# Patient Record
Sex: Male | Born: 1990 | Race: White | Hispanic: No | Marital: Single | Smoking: Current every day smoker
Health system: Southern US, Community
[De-identification: ages and names within clinical notes are randomized; demographics above are authoritative.]

---
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
## 2010-05-28 ENCOUNTER — Encounter
Admission: RE | Admit: 2010-05-28 | Discharge: 2010-05-28 | Payer: Self-pay | Source: Home / Self Care | Attending: Pediatrics | Admitting: Pediatrics

## 2011-02-09 ENCOUNTER — Inpatient Hospital Stay (INDEPENDENT_AMBULATORY_CARE_PROVIDER_SITE_OTHER)
Admission: RE | Admit: 2011-02-09 | Discharge: 2011-02-09 | Disposition: A | Discharge status: Home / Self Care | Payer: Self-pay | Source: Ambulatory Visit | Attending: Family Medicine | Admitting: Family Medicine

## 2011-02-09 DIAGNOSIS — M545 Low back pain, unspecified: Secondary | ICD-10-CM

## 2013-11-15 ENCOUNTER — Encounter (HOSPITAL_BASED_OUTPATIENT_CLINIC_OR_DEPARTMENT_OTHER): Payer: Self-pay | Admitting: Emergency Medicine

## 2013-11-15 ENCOUNTER — Emergency Department (HOSPITAL_BASED_OUTPATIENT_CLINIC_OR_DEPARTMENT_OTHER): Payer: Self-pay

## 2013-11-15 ENCOUNTER — Emergency Department (HOSPITAL_BASED_OUTPATIENT_CLINIC_OR_DEPARTMENT_OTHER)
Admission: EM | Admit: 2013-11-15 | Discharge: 2013-11-15 | Disposition: A | Discharge status: Home / Self Care | Payer: Self-pay | Attending: Emergency Medicine | Admitting: Emergency Medicine

## 2013-11-15 DIAGNOSIS — J680 Bronchitis and pneumonitis due to chemicals, gases, fumes and vapors: Secondary | ICD-10-CM | POA: Insufficient documentation

## 2013-11-15 DIAGNOSIS — F17200 Nicotine dependence, unspecified, uncomplicated: Secondary | ICD-10-CM | POA: Insufficient documentation

## 2013-11-15 DIAGNOSIS — R630 Anorexia: Secondary | ICD-10-CM | POA: Insufficient documentation

## 2013-11-15 MED ORDER — ALBUTEROL SULFATE (2.5 MG/3ML) 0.083% IN NEBU
5.0000 mg | INHALATION_SOLUTION | Freq: Once | RESPIRATORY_TRACT | Status: AC
  Administered 2013-11-15: 5 mg via RESPIRATORY_TRACT
  Filled 2013-11-15: qty 6

## 2013-11-15 MED ORDER — ACETAMINOPHEN 325 MG PO TABS
650.0000 mg | ORAL_TABLET | Freq: Once | ORAL | Status: AC
  Administered 2013-11-15: 650 mg via ORAL
  Filled 2013-11-15: qty 2

## 2013-11-15 MED ORDER — ALBUTEROL SULFATE HFA 108 (90 BASE) MCG/ACT IN AERS: 1.0000 | INHALATION_SPRAY | Freq: Four times a day (QID) | RESPIRATORY_TRACT | Status: DC | PRN

## 2013-11-15 NOTE — ED Provider Notes (Signed)
CSN: [id number]     Arrival date & time 11/15/13  1034 History   First MD Initiated Contact with Patient 11/15/13 1059     Chief Complaint  Patient presents with  . 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 Patient normally wears a mask however was doing a brief cut and decided not to put on. This has never happened before. Patient denies lung disease history or heart problems. Symptoms are mild. Headache and mild nausea afterwards gradual onset generalized.  Patient is a 23 y.o. male presenting with headaches. The history is provided by the patient.  Headache Associated symptoms: cough   Associated symptoms: no abdominal pain, no back pain, no congestion, no fever, no neck pain, no neck stiffness and no vomiting     History reviewed. No pertinent past medical history. History reviewed. No pertinent past surgical history. History reviewed. No pertinent family history. History  Substance Use Topics  . Smoking status: Current Every Day Smoker  . Smokeless tobacco: Not on file  . Alcohol Use: Not on file    Review of Systems  Constitutional: Positive for appetite change. Negative for fever and chills.  HENT: Negative for congestion.   Eyes: Negative for visual disturbance.  Respiratory: Positive for cough and shortness of breath.   Cardiovascular: Negative for chest pain.  Gastrointestinal: Negative for vomiting and abdominal pain.  Genitourinary: Negative for dysuria and flank pain.  Musculoskeletal: Negative for back pain, neck pain and neck stiffness.  Skin: Negative for rash.  Neurological: Positive for light-headedness and headaches.      Allergies  Review of patient's allergies indicates no known  allergies.  Home Medications   Prior to Admission medications   Not on File   BP 130/78  Pulse 63  Temp(Src) 98 F (36.7 C) (Oral)  Resp 18  SpO2 100% Physical Exam  Nursing note and vitals reviewed. Constitutional: He is oriented to person, place, and time. He appears well-developed and well-nourished.  HENT:  Head: Normocephalic and atraumatic.  Eyes: Conjunctivae are normal. Right eye exhibits no discharge. Left eye exhibits no discharge.  Neck: Normal range of motion. Neck supple. No tracheal deviation present.  Cardiovascular: Normal rate and regular rhythm.   Pulmonary/Chest: Effort normal and breath sounds normal.  Neurological: He is alert and oriented to person, place, and time. No cranial nerve deficit.  Skin: Skin is warm. No rash noted.  Psychiatric: He has a normal mood and affect.    ED Course  Procedures (including critical care time) Labs Review Labs Reviewed - No data to display  Imaging Review No results found.   EKG Interpretation None      MDM   Final diagnoses:  Chemical pneumonitis   Well-appearing male with clinically chemical pneumonitis from breathing in dust and chemicals at work. Stressed the importance of quitting smoking and wearing protective equipment. Nebulizer in the ED and chest x-ray. Followup discussed.  Pt refused xray due to cost, pt did well in ED.   Smoking cessation and outpatient follow up was discussed with the patient for 3 to 5  Minutes.   Results and differential diagnosis were discussed with the patient/parent/guardian. Close follow up outpatient was discussed, comfortable with the plan.   Medications  albuterol (PROVENTIL) (2.5 MG/3ML) 0.083% nebulizer solution 5 mg (5 mg Nebulization Given 11/15/13 1108)  acetaminophen (  TYLENOL) tablet 650 mg (650 mg Oral Given 11/15/13 1107)    Filed Vitals:   11/15/13 1043 11/15/13 1108  BP: 130/78   Pulse: 63   Temp: 98 F (36.7 C)   TempSrc: Oral   Resp: 18   SpO2:  100% 100%         Enid Skeens, MD 11/16/13 0710

## 2013-11-15 NOTE — Discharge Instructions (Signed)
If you were given medicines take as directed.  If you are on coumadin or contraceptives realize their levels and effectiveness is altered by many different medicines.  If you have any reaction (rash, tongues swelling, other) to the medicines stop taking and see a physician.   Please follow up as directed and return to the ER or see a physician for new or worsening symptoms.  Thank you. Filed Vitals:   11/15/13 1043 11/15/13 1108  BP: 130/78   Pulse: 63   Temp: 98 F (36.7 C)   TempSrc: Oral   Resp: 18   SpO2: 100% 100%

## 2013-11-15 NOTE — ED Notes (Signed)
Pt refused x-ray d/t no insurance. MD aware.
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
## 2017-02-01 ENCOUNTER — Emergency Department (HOSPITAL_COMMUNITY): Payer: BLUE CROSS/BLUE SHIELD

## 2017-02-01 ENCOUNTER — Emergency Department (HOSPITAL_COMMUNITY)
Admission: EM | Admit: 2017-02-01 | Discharge: 2017-02-01 | Disposition: A | Discharge status: Home / Self Care | Payer: BLUE CROSS/BLUE SHIELD | Attending: Emergency Medicine | Admitting: Emergency Medicine

## 2017-02-01 ENCOUNTER — Encounter (HOSPITAL_COMMUNITY): Payer: Self-pay | Admitting: Emergency Medicine

## 2017-02-01 DIAGNOSIS — R200 Anesthesia of skin: Secondary | ICD-10-CM | POA: Diagnosis not present

## 2017-02-01 DIAGNOSIS — F17210 Nicotine dependence, cigarettes, uncomplicated: Secondary | ICD-10-CM | POA: Diagnosis not present

## 2017-02-01 DIAGNOSIS — Y939 Activity, unspecified: Secondary | ICD-10-CM | POA: Insufficient documentation

## 2017-02-01 DIAGNOSIS — R51 Headache: Secondary | ICD-10-CM | POA: Diagnosis not present

## 2017-02-01 DIAGNOSIS — R109 Unspecified abdominal pain: Secondary | ICD-10-CM | POA: Insufficient documentation

## 2017-02-01 DIAGNOSIS — S3991XA Unspecified injury of abdomen, initial encounter: Secondary | ICD-10-CM | POA: Diagnosis present

## 2017-02-01 DIAGNOSIS — S301XXA Contusion of abdominal wall, initial encounter: Secondary | ICD-10-CM | POA: Diagnosis not present

## 2017-02-01 DIAGNOSIS — S3981XA Other specified injuries of abdomen, initial encounter: Secondary | ICD-10-CM

## 2017-02-01 DIAGNOSIS — T07XXXA Unspecified multiple injuries, initial encounter: Secondary | ICD-10-CM

## 2017-02-01 DIAGNOSIS — Y92410 Unspecified street and highway as the place of occurrence of the external cause: Secondary | ICD-10-CM | POA: Diagnosis not present

## 2017-02-01 DIAGNOSIS — Y999 Unspecified external cause status: Secondary | ICD-10-CM | POA: Diagnosis not present

## 2017-02-01 LAB — COMPREHENSIVE METABOLIC PANEL
ALK PHOS: 56 U/L (ref 38–126)
ALT: 18 U/L (ref 17–63)
AST: 21 U/L (ref 15–41)
Albumin: 4.2 g/dL (ref 3.5–5.0)
Anion gap: 7 (ref 5–15)
BUN: 9 mg/dL (ref 6–20)
CO2: 25 mmol/L (ref 22–32)
CREATININE: 0.91 mg/dL (ref 0.61–1.24)
Calcium: 9 mg/dL (ref 8.9–10.3)
Chloride: 104 mmol/L (ref 101–111)
GFR calc Af Amer: >60 mL/min (ref >60)
Glucose, Bld: 85 mg/dL (ref 65–99)
Potassium: 4.1 mmol/L (ref 3.5–5.1)
Sodium: 136 mmol/L (ref 135–145)
TOTAL PROTEIN: 6.7 g/dL (ref 6.5–8.1)
Total Bilirubin: 0.9 mg/dL (ref 0.3–1.2)

## 2017-02-01 LAB — CBC
HCT: 44.7 % (ref 39.0–52.0)
Hemoglobin: 15.4 g/dL (ref 13.0–17.0)
MCH: 31.6 pg (ref 26.0–34.0)
MCHC: 34.5 g/dL (ref 30.0–36.0)
MCV: 91.8 fL (ref 78.0–100.0)
PLATELETS: 164 10*3/uL (ref 150–400)
RBC: 4.87 MIL/uL (ref 4.22–5.81)
RDW: 12.6 % (ref 11.5–15.5)
WBC: 16.1 10*3/uL — AB (ref 4.0–10.5)

## 2017-02-01 LAB — I-STAT CHEM 8, ED
BUN: 10 mg/dL (ref 6–20)
CREATININE: 0.8 mg/dL (ref 0.61–1.24)
Calcium, Ion: 1.13 mmol/L — ABNORMAL LOW (ref 1.15–1.40)
Chloride: 103 mmol/L (ref 101–111)
Glucose, Bld: 82 mg/dL (ref 65–99)
HEMATOCRIT: 46 % (ref 39.0–52.0)
Hemoglobin: 15.6 g/dL (ref 13.0–17.0)
POTASSIUM: 4.1 mmol/L (ref 3.5–5.1)
Sodium: 139 mmol/L (ref 135–145)
TCO2: 25 mmol/L (ref 22–32)

## 2017-02-01 LAB — URINALYSIS, ROUTINE W REFLEX MICROSCOPIC
BILIRUBIN URINE: NEGATIVE
Glucose, UA: NEGATIVE mg/dL
Hgb urine dipstick: NEGATIVE
KETONES UR: NEGATIVE mg/dL
Leukocytes, UA: NEGATIVE
NITRITE: NEGATIVE
PH: 6 (ref 5.0–8.0)
Protein, ur: NEGATIVE mg/dL
Specific Gravity, Urine: 1.034 — ABNORMAL HIGH (ref 1.005–1.030)

## 2017-02-01 LAB — PROTIME-INR
INR: 1.09
PROTHROMBIN TIME: 14 s (ref 11.4–15.2)

## 2017-02-01 LAB — SAMPLE TO BLOOD BANK

## 2017-02-01 LAB — I-STAT CG4 LACTIC ACID, ED: Lactic Acid, Venous: 0.99 mmol/L (ref 0.5–1.9)

## 2017-02-01 LAB — ETHANOL: Alcohol, Ethyl (B): <10 mg/dL (ref <10)

## 2017-02-01 MED ORDER — IOPAMIDOL (ISOVUE-300) INJECTION 61%
INTRAVENOUS | Status: AC
  Administered 2017-02-01: 100 mL
  Filled 2017-02-01: qty 100

## 2017-02-01 MED ORDER — NICOTINE 21 MG/24HR TD PT24
21.0000 mg | MEDICATED_PATCH | Freq: Every day | TRANSDERMAL | Status: DC
  Administered 2017-02-01: 21 mg via TRANSDERMAL
  Filled 2017-02-01: qty 1

## 2017-02-01 MED ORDER — SODIUM CHLORIDE 0.9 % IV BOLUS (SEPSIS)
500.0000 mL | Freq: Once | INTRAVENOUS | Status: AC
  Administered 2017-02-01: 500 mL via INTRAVENOUS

## 2017-02-01 MED ORDER — ONDANSETRON HCL 4 MG/2ML IJ SOLN
4.0000 mg | Freq: Once | INTRAMUSCULAR | Status: AC
  Administered 2017-02-01: 4 mg via INTRAVENOUS
  Filled 2017-02-01: qty 2

## 2017-02-01 MED ORDER — FENTANYL CITRATE (PF) 100 MCG/2ML IJ SOLN
50.0000 ug | Freq: Once | INTRAMUSCULAR | Status: AC
  Administered 2017-02-01: 50 ug via INTRAVENOUS
  Filled 2017-02-01: qty 2

## 2017-02-01 NOTE — ED Notes (Signed)
Patient transported to X-ray 
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
## 2017-02-01 NOTE — Discharge Instructions (Signed)

## 2017-02-01 NOTE — ED Notes (Signed)
PT states understanding of care given, follow up care, and medication prescribed. PT ambulated from ED to car with a steady gait. 
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
## 2021-07-13 ENCOUNTER — Other Ambulatory Visit: Payer: Self-pay

## 2021-07-13 ENCOUNTER — Ambulatory Visit (INDEPENDENT_AMBULATORY_CARE_PROVIDER_SITE_OTHER): Payer: Self-pay | Admitting: Orthopaedic Surgery

## 2021-07-13 DIAGNOSIS — S83512A Sprain of anterior cruciate ligament of left knee, initial encounter: Secondary | ICD-10-CM | POA: Insufficient documentation

## 2021-07-13 NOTE — Progress Notes (Signed)
? ?Office Visit Note ?  ?Patient: Keith Frank           ?Date of Birth: April 14, 1991           ?MRN: [id number] ?Visit Date: 07/13/2021 ?             ?Requested by: No referring provider defined for this encounter. ?PCP: Patient, No Pcp Per (Inactive) ? ? ?Assessment & Plan: ?Visit Diagnoses:  ?1. New ACL tear, left, initial encounter   ? ? ?Plan: Impression is ACL tear and MCL tear.  Has been entire year since the injury so we will need an updated MRI mainly to look at the condition of the MCL to see if this has healed as well as to evaluate the medial meniscus which was torn on the initial MRI.  This will need to be done prior to surgery for surgical planning since the patient is interested in pursuing surgery at this time. ? ?Follow-Up Instructions: No follow-ups on file.  ? ?Orders:  ?No orders of the defined types were placed in this encounter. ? ?No orders of the defined types were placed in this encounter. ? ? ? ? Procedures: ?No procedures performed ? ? ?Clinical Data: ?No additional findings. ? ? ?Subjective: ?Chief Complaint  ?Patient presents with  ? Left Knee - Pain  ? ? ?HPI ? 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  He had an MRI done at [facility] tried imaging which confirmed this.  Unfortunately due to financial constraints he did not pursue surgery.  However in the last year he has had ongoing symptoms to his left knee with pain and locking popping stiffness and giving way.  Currently unemployed.  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 ? ?Review of Systems  ?Constitutional: Negative.   ?All other systems reviewed and are negative. ? ? ?Objective: ?Vital Signs: There were no vitals taken for this visit. ? ?Physical Exam ?Vitals and nursing note reviewed.  ?Constitutional:   ?   Appearance: He is well-developed.  ?HENT:  ?   Head: Normocephalic and atraumatic.  ?Eyes:  ?   Pupils: Pupils  are equal, round, and reactive to light.  ?Pulmonary:  ?   Effort: Pulmonary effort is normal.  ?Abdominal:  ?   Palpations: Abdomen is soft.  ?Musculoskeletal:     ?   General: Normal range of motion.  ?   Cervical back: Neck supple.  ?Skin: ?   General: Skin is warm.  ?Neurological:  ?   Mental Status: He is alert and oriented to person, place, and time.  ?Psychiatric:     ?   Behavior: Behavior normal.     ?   Thought Content: Thought content normal.     ?   Judgment: Judgment normal.  ? ? ?Ortho Exam ? ?Examination of the left knee shows unstable Lachman and anterior drawer.  Pivot shift.  MCL testing shows 2+ laxity with solid endpoint.  PCL feels stable. ? ?Specialty Comments:  ?No specialty comments available. ? ?Imaging: ?No results found. ? ? ?PMFS History: ?Patient Active Problem List  ? Diagnosis Date Noted  ? New ACL tear, left, initial encounter 07/13/2021  ? ?No past medical history on file.  ?No family history on file.  ?No past surgical history on file. ?Social History  ? ?Occupational History  ? Not on file  ?Tobacco Use  ?  Smoking status: Every Day  ? Smokeless tobacco: Never  ?Substance and Sexual Activity  ? Alcohol use: Yes  ? Drug use: No  ? Sexual activity: Not on file  ? ? ? ? ? ? ?

## 2021-08-09 ENCOUNTER — Telehealth: Payer: Self-pay | Admitting: *Deleted

## 2021-08-09 DIAGNOSIS — S83512A Sprain of anterior cruciate ligament of left knee, initial encounter: Secondary | ICD-10-CM

## 2021-08-09 NOTE — Telephone Encounter (Signed)
Pt called stating he was in on March 15 saw Dr. Erlinda Hong and Erlinda Hong had mentioned getting an MRI done of knee. Pt wants to go ahead and get that done now that he has insurance.  ?

## 2021-08-09 NOTE — Telephone Encounter (Signed)
Yes, thank you.

## 2021-08-09 NOTE — Telephone Encounter (Signed)
Order entered. Thanks.

## 2021-08-09 NOTE — Telephone Encounter (Signed)
Ok to order 

## 2021-08-14 ENCOUNTER — Ambulatory Visit
Admission: RE | Admit: 2021-08-14 | Discharge: 2021-08-14 | Disposition: A | Discharge status: Home / Self Care | Payer: Medicaid Other | Source: Ambulatory Visit | Attending: Physician Assistant | Admitting: Physician Assistant

## 2021-08-14 DIAGNOSIS — S83512A Sprain of anterior cruciate ligament of left knee, initial encounter: Secondary | ICD-10-CM

## 2021-08-16 NOTE — Progress Notes (Signed)
Fu to discuss mri

## 2021-08-17 ENCOUNTER — Telehealth: Payer: Self-pay | Admitting: Orthopaedic Surgery

## 2021-08-17 NOTE — Telephone Encounter (Signed)
Called pt but unable to leave vm, pt needs to sch f/u to discuss mri with Roda Shutters ?

## 2021-08-26 ENCOUNTER — Ambulatory Visit (INDEPENDENT_AMBULATORY_CARE_PROVIDER_SITE_OTHER): Payer: Self-pay | Admitting: Orthopaedic Surgery

## 2021-08-26 ENCOUNTER — Encounter: Payer: Self-pay | Admitting: Orthopaedic Surgery

## 2021-08-26 DIAGNOSIS — S83242A Other tear of medial meniscus, current injury, left knee, initial encounter: Secondary | ICD-10-CM

## 2021-08-26 DIAGNOSIS — S83512A Sprain of anterior cruciate ligament of left knee, initial encounter: Secondary | ICD-10-CM

## 2021-08-26 NOTE — Progress Notes (Signed)
? ?Office Visit Note ?  ?Patient: Keith Frank           ?Date of Birth: 11/29/1990           ?MRN: [id number] ?Visit Date: 08/26/2021 ?             ?Requested by: No referring provider defined for this encounter. ?PCP: Patient, No Pcp Per (Inactive) ? ? ?Assessment & Plan: ?Visit Diagnoses:  ?1. New ACL tear, left, initial encounter   ?2. Acute medial meniscus tear of left knee, initial encounter   ? ? ?Plan: MRI of the left knee shows undersurface tear of the medial meniscus, chronic partial tear of the ACL and thickening of the MCL.  Functionally his ACL is ruptured.  The ACL appears amorphus on MRI and I do not feel that the remaining fibers are functional.  Certainly he is reporting instability and giving way and physical exam is consistent with ACL rupture.  I have recommended ACL reconstruction with quadriceps autograft.  We will evaluate the medial meniscus as well.  MCL does not need any surgical intervention.  Risk benefits rehab recovery prognosis of the surgery reviewed with [name] and his mother in detail.  All questions answered to their satisfaction.  Eunice Blase will call the patient to schedule surgery. ? ?Total face to face encounter time was greater than 25 minutes and over half of this time was spent in counseling and/or coordination of care. ? ?Follow-Up Instructions: No follow-ups on file.  ? ?Orders:  ?No orders of the defined types were placed in this encounter. ? ?No orders of the defined types were placed in this encounter. ? ? ? ? Procedures: ?No procedures performed ? ? ?Clinical Data: ?No additional findings. ? ? ?Subjective: ?Chief Complaint  ?Patient presents with  ? Left Knee - Pain  ? ? ?HPI ? ?[name] returns with his mother today to review recent MRI of the left knee. ? ?Review of Systems  ?Constitutional: Negative.   ?All other systems reviewed and are negative. ? ? ?Objective: ?Vital Signs: There were no vitals taken for this visit. ? ?Physical Exam ?Vitals and nursing note reviewed.   ?Constitutional:   ?   Appearance: He is well-developed.  ?Pulmonary:  ?   Effort: Pulmonary effort is normal.  ?Abdominal:  ?   Palpations: Abdomen is soft.  ?Skin: ?   General: Skin is warm.  ?Neurological:  ?   Mental Status: He is alert and oriented to person, place, and time.  ?Psychiatric:     ?   Behavior: Behavior normal.     ?   Thought Content: Thought content normal.     ?   Judgment: Judgment normal.  ? ? ?Ortho Exam ? ?Examination of the left knee shows normal range of motion.  MCL shows 1+ with solid endpoint.  Abnormal anterior drawer and Lachman test.  Negative pivot shift.  No medial joint line tenderness. ? ?Specialty Comments:  ?No specialty comments available. ? ?Imaging: ?No results found. ? ? ?PMFS History: ?Patient Active Problem List  ? Diagnosis Date Noted  ? Acute medial meniscus tear of left knee 08/26/2021  ? New ACL tear, left, initial encounter 07/13/2021  ? ?History reviewed. No pertinent past medical history.  ?History reviewed. No pertinent family history.  ?History reviewed. No pertinent surgical history. ?Social History  ? ?Occupational History  ? Not on file  ?Tobacco Use  ? Smoking status: Every Day  ? Smokeless tobacco: Never  ?Substance and Sexual Activity  ?  Alcohol use: Yes  ? Drug use: No  ? Sexual activity: Not on file  ? ? ? ? ? ? ?

## 2021-09-15 ENCOUNTER — Telehealth: Payer: Self-pay | Admitting: Orthopaedic Surgery

## 2021-09-15 NOTE — Telephone Encounter (Signed)
Pt called stating his cell phone has been off for awhile and been using a wifi number. If we need to contact pt because he has upcoming surgery in June. Pt phone number is [phone]. ?

## 2021-09-17 ENCOUNTER — Encounter (HOSPITAL_BASED_OUTPATIENT_CLINIC_OR_DEPARTMENT_OTHER): Payer: Self-pay | Admitting: Orthopaedic Surgery

## 2021-09-17 ENCOUNTER — Other Ambulatory Visit: Payer: Self-pay

## 2021-09-28 NOTE — Progress Notes (Signed)

## 2021-09-29 ENCOUNTER — Encounter: Payer: Self-pay | Admitting: Orthopaedic Surgery

## 2021-09-29 ENCOUNTER — Ambulatory Visit (HOSPITAL_BASED_OUTPATIENT_CLINIC_OR_DEPARTMENT_OTHER): Payer: Self-pay

## 2021-09-29 ENCOUNTER — Other Ambulatory Visit: Payer: Self-pay

## 2021-09-29 ENCOUNTER — Ambulatory Visit (HOSPITAL_BASED_OUTPATIENT_CLINIC_OR_DEPARTMENT_OTHER)
Admission: RE | Admit: 2021-09-29 | Discharge: 2021-09-29 | Disposition: A | Discharge status: Home / Self Care | Payer: Self-pay | Attending: Orthopaedic Surgery | Admitting: Orthopaedic Surgery

## 2021-09-29 ENCOUNTER — Encounter (HOSPITAL_BASED_OUTPATIENT_CLINIC_OR_DEPARTMENT_OTHER): Payer: Self-pay | Admitting: Orthopaedic Surgery

## 2021-09-29 ENCOUNTER — Ambulatory Visit (HOSPITAL_BASED_OUTPATIENT_CLINIC_OR_DEPARTMENT_OTHER): Payer: Self-pay | Admitting: Anesthesiology

## 2021-09-29 ENCOUNTER — Encounter (HOSPITAL_BASED_OUTPATIENT_CLINIC_OR_DEPARTMENT_OTHER)
Admission: RE | Disposition: A | Discharge status: Home / Self Care | Payer: Self-pay | Source: Home / Self Care | Attending: Orthopaedic Surgery

## 2021-09-29 DIAGNOSIS — S83242A Other tear of medial meniscus, current injury, left knee, initial encounter: Secondary | ICD-10-CM

## 2021-09-29 DIAGNOSIS — F17200 Nicotine dependence, unspecified, uncomplicated: Secondary | ICD-10-CM | POA: Insufficient documentation

## 2021-09-29 DIAGNOSIS — S83512A Sprain of anterior cruciate ligament of left knee, initial encounter: Secondary | ICD-10-CM

## 2021-09-29 DIAGNOSIS — Z01818 Encounter for other preprocedural examination: Secondary | ICD-10-CM

## 2021-09-29 DIAGNOSIS — X58XXXA Exposure to other specified factors, initial encounter: Secondary | ICD-10-CM | POA: Insufficient documentation

## 2021-09-29 HISTORY — PX: KNEE ARTHROSCOPY WITH ANTERIOR CRUCIATE LIGAMENT (ACL) REPAIR: SHX5644

## 2021-09-29 SURGERY — KNEE ARTHROSCOPY WITH ANTERIOR CRUCIATE LIGAMENT (ACL) REPAIR
Anesthesia: Regional | Site: Knee | Laterality: Left

## 2021-09-29 MED ORDER — PROPOFOL 10 MG/ML IV BOLUS
INTRAVENOUS | Status: DC | PRN
  Administered 2021-09-29: 200 mg via INTRAVENOUS

## 2021-09-29 MED ORDER — LACTATED RINGERS IV SOLN: INTRAVENOUS | Status: DC

## 2021-09-29 MED ORDER — ONDANSETRON HCL 4 MG PO TABS: 4.0000 mg | ORAL_TABLET | Freq: Three times a day (TID) | ORAL | 0 refills | Status: AC | PRN

## 2021-09-29 MED ORDER — FENTANYL CITRATE (PF) 100 MCG/2ML IJ SOLN
INTRAMUSCULAR | Status: AC
  Filled 2021-09-29: qty 2

## 2021-09-29 MED ORDER — OXYCODONE HCL 5 MG PO TABS
ORAL_TABLET | ORAL | Status: AC
  Filled 2021-09-29: qty 1

## 2021-09-29 MED ORDER — ACETAMINOPHEN 500 MG PO TABS
1000.0000 mg | ORAL_TABLET | Freq: Once | ORAL | Status: AC
  Administered 2021-09-29: 1000 mg via ORAL

## 2021-09-29 MED ORDER — AMISULPRIDE (ANTIEMETIC) 5 MG/2ML IV SOLN: 10.0000 mg | Freq: Once | INTRAVENOUS | Status: DC | PRN

## 2021-09-29 MED ORDER — ACETAMINOPHEN 500 MG PO TABS
ORAL_TABLET | ORAL | Status: AC
  Filled 2021-09-29: qty 2

## 2021-09-29 MED ORDER — MIDAZOLAM HCL 2 MG/2ML IJ SOLN
2.0000 mg | Freq: Once | INTRAMUSCULAR | Status: AC
  Administered 2021-09-29: 2 mg via INTRAVENOUS

## 2021-09-29 MED ORDER — OXYCODONE-ACETAMINOPHEN 5-325 MG PO TABS: 1.0000 | ORAL_TABLET | Freq: Two times a day (BID) | ORAL | 0 refills | Status: DC | PRN

## 2021-09-29 MED ORDER — OXYCODONE HCL 5 MG PO TABS
5.0000 mg | ORAL_TABLET | Freq: Once | ORAL | Status: AC | PRN
  Administered 2021-09-29: 5 mg via ORAL

## 2021-09-29 MED ORDER — LIDOCAINE 2% (20 MG/ML) 5 ML SYRINGE
INTRAMUSCULAR | Status: AC
Start: 2021-09-29 — End: ?
  Filled 2021-09-29: qty 5

## 2021-09-29 MED ORDER — FENTANYL CITRATE (PF) 100 MCG/2ML IJ SOLN
25.0000 ug | INTRAMUSCULAR | Status: DC | PRN
  Administered 2021-09-29 (×2): 50 ug via INTRAVENOUS

## 2021-09-29 MED ORDER — ONDANSETRON HCL 4 MG/2ML IJ SOLN
INTRAMUSCULAR | Status: AC
  Filled 2021-09-29: qty 2

## 2021-09-29 MED ORDER — FENTANYL CITRATE (PF) 100 MCG/2ML IJ SOLN
100.0000 ug | Freq: Once | INTRAMUSCULAR | Status: AC
  Administered 2021-09-29: 100 ug via INTRAVENOUS

## 2021-09-29 MED ORDER — DEXAMETHASONE SODIUM PHOSPHATE 10 MG/ML IJ SOLN
INTRAMUSCULAR | Status: AC
Start: 2021-09-29 — End: ?
  Filled 2021-09-29: qty 1

## 2021-09-29 MED ORDER — MIDAZOLAM HCL 2 MG/2ML IJ SOLN
INTRAMUSCULAR | Status: AC
  Filled 2021-09-29: qty 2

## 2021-09-29 MED ORDER — ONDANSETRON HCL 4 MG/2ML IJ SOLN: 4.0000 mg | Freq: Once | INTRAMUSCULAR | Status: DC | PRN

## 2021-09-29 MED ORDER — LIDOCAINE HCL (CARDIAC) PF 100 MG/5ML IV SOSY
PREFILLED_SYRINGE | INTRAVENOUS | Status: DC | PRN
  Administered 2021-09-29: 80 mg via INTRAVENOUS

## 2021-09-29 MED ORDER — SENNOSIDES-DOCUSATE SODIUM 8.6-50 MG PO TABS: 1.0000 | ORAL_TABLET | Freq: Every evening | ORAL | 1 refills | Status: AC | PRN

## 2021-09-29 MED ORDER — OXYCODONE HCL 5 MG/5ML PO SOLN: 5.0000 mg | Freq: Once | ORAL | Status: AC | PRN

## 2021-09-29 MED ORDER — CEFAZOLIN SODIUM-DEXTROSE 2-4 GM/100ML-% IV SOLN
INTRAVENOUS | Status: AC
  Filled 2021-09-29: qty 100

## 2021-09-29 MED ORDER — METHOCARBAMOL 500 MG PO TABS: 500.0000 mg | ORAL_TABLET | Freq: Four times a day (QID) | ORAL | 2 refills | Status: AC | PRN

## 2021-09-29 MED ORDER — BUPIVACAINE HCL (PF) 0.5 % IJ SOLN
INTRAMUSCULAR | Status: DC | PRN
  Administered 2021-09-29: 30 mL

## 2021-09-29 MED ORDER — DEXAMETHASONE SODIUM PHOSPHATE 10 MG/ML IJ SOLN
INTRAMUSCULAR | Status: DC | PRN
  Administered 2021-09-29: 10 mg via INTRAVENOUS

## 2021-09-29 MED ORDER — KETOROLAC TROMETHAMINE 10 MG PO TABS: 10.0000 mg | ORAL_TABLET | Freq: Two times a day (BID) | ORAL | 0 refills | Status: AC | PRN

## 2021-09-29 MED ORDER — FENTANYL CITRATE (PF) 100 MCG/2ML IJ SOLN
INTRAMUSCULAR | Status: DC | PRN
Start: 2021-09-29 — End: 2021-09-29
  Administered 2021-09-29 (×4): 50 ug via INTRAVENOUS

## 2021-09-29 MED ORDER — CEFAZOLIN SODIUM-DEXTROSE 2-4 GM/100ML-% IV SOLN
2.0000 g | INTRAVENOUS | Status: AC
  Administered 2021-09-29: 2 g via INTRAVENOUS

## 2021-09-29 MED ORDER — ONDANSETRON HCL 4 MG/2ML IJ SOLN
INTRAMUSCULAR | Status: DC | PRN
  Administered 2021-09-29: 4 mg via INTRAVENOUS

## 2021-09-29 MED ORDER — SODIUM CHLORIDE 0.9 % IR SOLN
Status: DC | PRN
  Administered 2021-09-29: 12000 mL

## 2021-09-29 SURGICAL SUPPLY — 82 items
APL SKNCLS STERI-STRIP NONHPOA (GAUZE/BANDAGES/DRESSINGS) ×1
BANDAGE ESMARK 6X9 LF (GAUZE/BANDAGES/DRESSINGS) ×1 IMPLANT
BENZOIN TINCTURE PRP APPL 2/3 (GAUZE/BANDAGES/DRESSINGS) ×2 IMPLANT
BLADE AVERAGE 25X9 (BLADE) ×2 IMPLANT
BLADE SHAVER TORPEDO 4X13 (MISCELLANEOUS) ×1 IMPLANT
BLADE SURG 15 STRL LF DISP TIS (BLADE) ×1 IMPLANT
BLADE SURG 15 STRL SS (BLADE) ×2
BNDG CMPR 9X6 STRL LF SNTH (GAUZE/BANDAGES/DRESSINGS) ×1
BNDG ELASTIC 6X5.8 VLCR STR LF (GAUZE/BANDAGES/DRESSINGS) ×2 IMPLANT
BNDG ESMARK 6X9 LF (GAUZE/BANDAGES/DRESSINGS) ×2
COOLER ICEMAN CLASSIC (MISCELLANEOUS) ×2 IMPLANT
COVER BACK TABLE 60X90IN (DRAPES) ×1 IMPLANT
CUFF TOURN SGL QUICK 18X4 (TOURNIQUET CUFF) ×1 IMPLANT
CUFF TOURN SGL QUICK 34 (TOURNIQUET CUFF) ×2
CUFF TRNQT CYL 34X4.125X (TOURNIQUET CUFF) ×1 IMPLANT
DRAPE ARTHROSCOPY W/POUCH 90 (DRAPES) ×2 IMPLANT
DRAPE IMP U-DRAPE 54X76 (DRAPES) ×2 IMPLANT
DRAPE INCISE IOBAN 66X45 STRL (DRAPES) IMPLANT
DRAPE U-SHAPE 47X51 STRL (DRAPES) ×2 IMPLANT
DRAPE-T ARTHROSCOPY W/POUCH (DRAPES) ×1 IMPLANT
DRSG PAD ABDOMINAL 8X10 ST (GAUZE/BANDAGES/DRESSINGS) ×2 IMPLANT
DURAPREP 26ML APPLICATOR (WOUND CARE) ×3 IMPLANT
ELECT REM PT RETURN 9FT ADLT (ELECTROSURGICAL) ×2
ELECTRODE REM PT RTRN 9FT ADLT (ELECTROSURGICAL) ×1 IMPLANT
GAUZE SPONGE 4X4 12PLY STRL (GAUZE/BANDAGES/DRESSINGS) ×4 IMPLANT
GAUZE XEROFORM 1X8 LF (GAUZE/BANDAGES/DRESSINGS) ×2 IMPLANT
GLOVE ECLIPSE 7.0 STRL STRAW (GLOVE) ×4 IMPLANT
GLOVE INDICATOR 7.0 STRL GRN (GLOVE) ×3 IMPLANT
GLOVE INDICATOR 7.5 STRL GRN (GLOVE) ×3 IMPLANT
GLOVE SURG SYN 7.5  E (GLOVE) ×2
GLOVE SURG SYN 7.5 E (GLOVE) ×1 IMPLANT
GLOVE SURG SYN 7.5 PF PI (GLOVE) ×1 IMPLANT
GOWN STRL REIN XL XLG (GOWN DISPOSABLE) ×2 IMPLANT
GOWN STRL REUS W/ TWL LRG LVL3 (GOWN DISPOSABLE) ×1 IMPLANT
GOWN STRL REUS W/ TWL XL LVL3 (GOWN DISPOSABLE) ×1 IMPLANT
GOWN STRL REUS W/TWL LRG LVL3 (GOWN DISPOSABLE) ×2
GOWN STRL REUS W/TWL XL LVL3 (GOWN DISPOSABLE) ×2
IMMOBILIZER KNEE 22 UNIV (SOFTGOODS) ×1 IMPLANT
IMMOBILIZER KNEE 24 THIGH 36 (MISCELLANEOUS) IMPLANT
IMMOBILIZER KNEE 24 UNIV (MISCELLANEOUS)
IMP SYS 2ND FIX PEEK 4.75X19.1 (Miscellaneous) ×2 IMPLANT
IMPL QUADLINK SYSTEM 9 (Orthopedic Implant) IMPLANT
IMPL SYS 2ND FX PEEK 4.75X19.1 (Miscellaneous) IMPLANT
IMPLANT QUADLINK SYSTEM 9 (Orthopedic Implant) ×2 IMPLANT
KIT TRANSTIBIAL (DISPOSABLE) ×2 IMPLANT
KNIFE GRAFT ACL 10MM 5952 (MISCELLANEOUS) ×2 IMPLANT
KNIFE GRAFT ACL 9MM (MISCELLANEOUS) ×3 IMPLANT
MANIFOLD NEPTUNE II (INSTRUMENTS) ×2 IMPLANT
NDL SUT 6 .5 CRC .975X.05 MAYO (NEEDLE) IMPLANT
NEEDLE MAYO TAPER (NEEDLE)
PACK ARTHROSCOPY DSU (CUSTOM PROCEDURE TRAY) ×2 IMPLANT
PACK BASIN DAY SURGERY FS (CUSTOM PROCEDURE TRAY) ×2 IMPLANT
PAD COLD SHLDR WRAP-ON (PAD) ×2 IMPLANT
PADDING CAST COTTON 6X4 STRL (CAST SUPPLIES) ×2 IMPLANT
PENCIL SMOKE EVACUATOR (MISCELLANEOUS) ×2 IMPLANT
PORT APPOLLO RF 90DEGREE MULTI (SURGICAL WAND) ×1 IMPLANT
SHEET MEDIUM DRAPE 40X70 STRL (DRAPES) ×2 IMPLANT
SLEEVE SCD COMPRESS KNEE MED (STOCKING) ×2 IMPLANT
SPONGE T-LAP 18X18 ~~LOC~~+RFID (SPONGE) ×2 IMPLANT
SPONGE T-LAP 4X18 ~~LOC~~+RFID (SPONGE) ×2 IMPLANT
STRIP CLOSURE SKIN 1/2X4 (GAUZE/BANDAGES/DRESSINGS) ×4 IMPLANT
SUCTION FRAZIER HANDLE 10FR (MISCELLANEOUS) ×2
SUCTION TUBE FRAZIER 10FR DISP (MISCELLANEOUS) ×1 IMPLANT
SUT ETHILON 2 0 FS 18 (SUTURE) IMPLANT
SUT ETHILON 3 0 PS 1 (SUTURE) IMPLANT
SUT ETHILON 4 0 PS 2 18 (SUTURE) ×2 IMPLANT
SUT FIBERWIRE #2 38 T-5 BLUE (SUTURE)
SUT MNCRL AB 3-0 PS2 18 (SUTURE) ×2 IMPLANT
SUT VIC AB 0 CT1 27 (SUTURE)
SUT VIC AB 0 CT1 27XBRD ANBCTR (SUTURE) IMPLANT
SUT VIC AB 1 CT1 27 (SUTURE) ×2
SUT VIC AB 1 CT1 27XBRD ANBCTR (SUTURE) IMPLANT
SUT VIC AB 2-0 CT1 27 (SUTURE) ×4
SUT VIC AB 2-0 CT1 TAPERPNT 27 (SUTURE) IMPLANT
SUT VIC AB 3-0 FS2 27 (SUTURE) IMPLANT
SUT VIC AB 3-0 SH 27 (SUTURE) ×2
SUT VIC AB 3-0 SH 27X BRD (SUTURE) ×1 IMPLANT
SUTURE FIBERWR #2 38 T-5 BLUE (SUTURE) IMPLANT
TOWEL GREEN STERILE FF (TOWEL DISPOSABLE) ×2 IMPLANT
TUBING ARTHROSCOPY IRRIG 16FT (MISCELLANEOUS) ×2 IMPLANT
WATER STERILE IRR 1000ML POUR (IV SOLUTION) ×2 IMPLANT
YANKAUER SUCT BULB TIP NO VENT (SUCTIONS) ×2 IMPLANT

## 2021-09-29 NOTE — Anesthesia Procedure Notes (Signed)
Procedure Name: LMA Insertion Date/Time: 09/29/2021 12:17 PM Performed by: Burna Cash, CRNA Pre-anesthesia Checklist: Patient identified, Emergency Drugs available, Suction available and Patient being monitored Patient Re-evaluated:Patient Re-evaluated prior to induction Oxygen Delivery Method: Circle system utilized Preoxygenation: Pre-oxygenation with 100% oxygen Induction Type: IV induction Ventilation: Mask ventilation without difficulty LMA: LMA inserted LMA Size: 4.0 Number of attempts: 1 Airway Equipment and Method: Bite block Placement Confirmation: positive ETCO2 Tube secured with: Tape Dental Injury: Teeth and Oropharynx as per pre-operative assessment

## 2021-09-29 NOTE — H&P (Signed)
    PREOPERATIVE H&P  Chief Complaint: left anterior cruciate ligament, medial meniscal tear  HPI: Keith Frank is a 31 y.o. male who presents for surgical treatment of left anterior cruciate ligament, medial meniscal tear.  He denies any changes in medical history.  History reviewed. No pertinent past medical history. History reviewed. No pertinent surgical history. Social History   Socioeconomic History   Marital status: Single    Spouse name: Not on file   Number of children: Not on file   Years of education: Not on file   Highest education level: Not on file  Occupational History   Not on file  Tobacco Use   Smoking status: Every Day   Smokeless tobacco: Never  Vaping Use   Vaping Use: Never used  Substance and Sexual Activity   Alcohol use: Yes    Comment: occ   Drug use: No   Sexual activity: Not on file  Other Topics Concern   Not on file  Social History Narrative   Not on file   Social Determinants of Health   Financial Resource Strain: Not on file  Food Insecurity: Not on file  Transportation Needs: Not on file  Physical Activity: Not on file  Stress: Not on file  Social Connections: Not on file   History reviewed. No pertinent family history. No Known Allergies Prior to Admission medications   Not on File     Positive ROS: All other systems have been reviewed and were otherwise negative with the exception of those mentioned in the HPI and as above.  Physical Exam: General: Alert, no acute distress Cardiovascular: No pedal edema Respiratory: No cyanosis, no use of accessory musculature GI: abdomen soft Skin: No lesions in the area of chief complaint Neurologic: Sensation intact distally Psychiatric: Patient is competent for consent with normal mood and affect Lymphatic: no lymphedema  MUSCULOSKELETAL: exam stable  Assessment: left anterior cruciate ligament, medial meniscal tear  Plan: Plan for Procedure(s): LEFT KNEE ANTERIOR CRUCIATE  LIGAMENT RECONSTRUCTION, PARTIAL MEDIAL MENISCECTOMY POSSIBLE REPAIR  The risks benefits and alternatives were discussed with the patient including but not limited to the risks of nonoperative treatment, versus surgical intervention including infection, bleeding, nerve injury,  blood clots, cardiopulmonary complications, morbidity, mortality, among others, and they were willing to proceed.   Preoperative templating of the joint replacement has been completed, documented, and submitted to the Operating Room personnel in order to optimize intra-operative equipment management.   Glee Arvin, MD 09/29/2021 9:59 AM

## 2021-09-29 NOTE — Op Note (Signed)
Surgery Date: 09/29/2021  PREOPERATIVE DIAGNOSES:  1. Left knee medial meniscus tear 2. Left knee anterior cruciate ligament tear  POSTOPERATIVE DIAGNOSES:  same  PROCEDURES PERFORMED:  1. Left knee diagnostic arthroscopy 2. Left knee arthroscopy with arthroscopic partial medial meniscectomy 3. Left knee arthroscopy with ACL reconstruction  SURGEON: N. Glee Arvin, M.D.  ASSIST: Keith Frank [place], [place]; necessary for the timely completion of procedure and due to complexity of procedure.  ANESTHESIA:  general  FLUIDS: Per anesthesia record.   ESTIMATED BLOOD LOSS: minimal  Implant Name Type Inv. Item Serial No. Manufacturer Lot No. LRB No. Used Action  quadlink implant Orthopedic Implant   [employer] [id number] Left 1 Implanted    DESCRIPTION OF PROCEDURE: [name] is a 31 y.o.-year-old male with above mentioned conditions. Full discussion held regarding risks benefits alternatives and complications related surgical intervention. Conservative care options reviewed. All questions answered.  The patient was identified in the preoperative holding area and the operative extremity was marked. The patient was brought to the operating room and transferred to operating table in a supine position. Satisfactory general anesthesia was induced by anesthesiology.  Examination of the left knee showed an abnormal Lachman, anterior drawer and pivot shift test.  Collateral testing was normal.  A longitudinal incision was first created overlying the distal quadriceps tendon.  Full-thickness flaps were raised off of the quadriceps and a 9 mm parallel blade was used to obtain an autograft from the central portion of the quadriceps.  The graft was approximately 70 mm in length.  The quadriceps was then repaired using a running locking #1 Vicryl.  Layered closure of this surgical site was later performed at the end of the surgery.  We then turned our attention to the arthroscopic portion of the  surgery.  Keith Frank prepared the graft on the back table and was critical for the surgery.  Standard anterolateral, anteromedial arthroscopy portals were obtained. The anteromedial portal was obtained with a spinal needle for localization under direct visualization with subsequent diagnostic findings.   We first inspected the cruciates which showed the remnant of the ACL tear.  The remnant was debrided away.  Notchplasty was performed.  PCL was unremarkable.  The lateral and patellofemoral compartments were unremarkable.  The medial compartment was then inspected and there was a small undersurface tear of the posterior horn.  The tear was not full-thickness.  I did not feel that this needed debridement or repair.  I did find a small radial tear of the anterior horn of the medial meniscus which was debrided back to stable margins with a shaver.  The weightbearing portion of the medial femoral condyle did show grade 2 changes with multiple fissures.  The cartilage was stable when probed.  We then turned our attention to reconstruction of the ACL.  Both femoral and tibial tunnels were prepared at their anatomic positions.  The femoral tunnel was prepared to a 10.5 mm socket and the tibial tunnel was 9.5 mm socket.  Soft tissue was cleared away for easy passage of the graft.  The graft was then passed through the anterior medial portal under arthroscopic visualization.  The femoral flip button was flipped on the lateral cortex and confirmed under fluoroscopy.  The graft was then slowly advanced into the femoral tunnel to the proper depth.  The tibial end of the graft was then advanced through the anterior medial portal using an all inside technique.  Passage of the tibial end of the graft was uncomplicated.  The knee was then cycled 10 times and the graft was tightened on both ends with the knee in full extension.  I used a 4.75 mm swivel lock in the tibia for additional fixation.  Repeat Lachman, anterior  drawer, pivot shift returned back to normal with about a millimeter of translation.  Gutters were checked for loose bodies.  Excess fluid was expressed from the knee joint.  Incisions were closed with Monocryl and Steri-Strips.  Sterile dressings were applied.  Patient tolerated procedure well had no immediate complications.  Suprapatellar pouch and gutters: no synovitis or debris. Patella chondral surface: Grade 0 Trochlear chondral surface: Grade 0 Patellofemoral tracking: normal Medial meniscus: small radial tear anterior horn, small incomplete undersurface tear posterior horn.  Medial femoral condyle weight bearing surface: Grade 2 Medial tibial plateau: Grade 0 Anterior cruciate ligament: torn Posterior cruciate ligament:stable Lateral meniscus: normal.   Lateral femoral condyle weight bearing surface: Grade 0 Lateral tibial plateau: Grade 0  DISPOSITION: The patient was awakened from general anesthetic, extubated, taken to the recovery room in medically stable condition, no apparent complications. The patient may be weightbearing as tolerated to the operative lower extremity.  Range of motion of right knee as tolerated.  Keith Reel, MD [facility] 2:16 PM

## 2021-09-29 NOTE — Transfer of Care (Signed)
Immediate Anesthesia Transfer of Care Note  Patient: Keith Frank  Procedure(s) Performed: LEFT KNEE ANTERIOR CRUCIATE LIGAMENT RECONSTRUCTION, PARTIAL MEDIAL MENISCECTOMY (Left: Knee)  Patient Location: PACU  Anesthesia Type:GA combined with regional for post-op pain  Level of Consciousness: awake, alert  and oriented  Airway & Oxygen Therapy: Patient Spontanous Breathing and Patient connected to face mask oxygen  Post-op Assessment: Report given to RN and Post -op Vital signs reviewed and stable  Post vital signs: Reviewed and stable  Last Vitals:  Vitals Value Taken Time  BP 142/77 09/29/21 1436  Temp    Pulse 102 09/29/21 1437  Resp 12 09/29/21 1437  SpO2 98 % 09/29/21 1437  Vitals shown include unvalidated device data.  Last Pain:  Vitals:   09/29/21 1009  PainSc: 0-No pain         Complications: No notable events documented.

## 2021-09-29 NOTE — Discharge Instructions (Addendum)
Post-operative patient instructions  Knee Arthroscopy   Ice:  Place intermittent ice or cooler pack over your knee, 30 minutes on and 30 minutes off.  Continue this for the first 72 hours after surgery, then save ice for use after therapy sessions or on more active days.   Weight:  You may bear weight on your leg as your symptoms allow. Crutches:  Use crutches (or walker) to assist in walking until told to discontinue by your physical therapist or physician. This will help to reduce pain. Strengthening:  Perform simple thigh squeezes (isometric quad contractions) and straight leg lifts as you are able (3 sets of 5 to 10 repetitions, 3 times a day).  For the leg lifts, have someone support under your ankle in the beginning until you have increased strength enough to do this on your own.  To help get started on thigh squeezes, place a pillow under your knee and push down on the pillow with back of knee (sometimes easier to do than with your leg fully straight). Motion:  Perform gentle knee motion as tolerated - this is gentle bending and straightening of the knee. Seated heel slides: you can start by sitting in a chair, remove your brace, and gently slide your heel back on the floor - allowing your knee to bend. Have someone help you straighten your knee (or use your other leg/foot hooked under your ankle.  Dressing:  Perform 1st dressing change at 3 days postoperative. A moderate amount of blood tinged drainage is to be expected.  So if you bleed through the dressing on the first or second day or if you have fevers, it is fine to change the dressing/check the wounds early and redress wound. Elevate your leg.  If it bleeds through again, or if the incisions are leaking frank blood, please call the office. May change dressing every 1-2 days thereafter to help watch wounds. Can purchase Tegaderm (or 7M Nexcare) water resistant dressings at local pharmacy / [facility]. Shower:  Light shower is ok after 3  days.  Please take shower, NO bath. Recover with gauze and ace wrap to help keep wounds protected.   Pain medication:  A narcotic pain medication has been prescribed.  Take as directed.  Typically you need narcotic pain medication more regularly during the first 3 to 5 days after surgery.  Decrease your use of the medication as the pain improves.  Narcotics can sometimes cause constipation, even after a few doses.  If you have problems with constipation, you can take an over the counter stool softener or light laxative.  If you have persistent problems, please notify your physician's office. Physical therapy: Additional activity guidelines to be provided by your physician or physical therapist at follow-up visits.  Driving: Do not recommend driving x 1 weeks post surgical, especially if surgery performed on right side. Should not drive while taking narcotic pain medications. It typically takes at least 2 weeks to restore sufficient neuromuscular function for normal reaction times for driving safety.  Call [phone] for questions or problems. Evenings you will be forwarded to the hospital operator.  Ask for the orthopaedic physician on call. Please call if you experience:    Redness, foul smelling, or persistent drainage from the surgical site  worsening knee pain and swelling not responsive to medication  any calf pain and or swelling of the lower leg  temperatures greater than 101.5 F other questions or concerns   Thank you for allowing [place] to be  a part of your care.     Post Anesthesia Home Care Instructions  Activity: Get plenty of rest for the remainder of the day. A responsible individual must stay with you for 24 hours following the procedure.  For the next 24 hours, DO NOT: -Drive a car -Paediatric nurse -Drink alcoholic beverages -Take any medication unless instructed by your physician -Make any legal decisions or sign important papers.  Meals: Start with liquid foods such as  gelatin or soup. Progress to regular foods as tolerated. Avoid greasy, spicy, heavy foods. If nausea and/or vomiting occur, drink only clear liquids until the nausea and/or vomiting subsides. Call your physician if vomiting continues.  Special Instructions/Symptoms: Your throat may feel dry or sore from the anesthesia or the breathing tube placed in your throat during surgery. If this causes discomfort, gargle with warm salt water. The discomfort should disappear within 24 hours.  If you had a scopolamine patch placed behind your ear for the management of post- operative nausea and/or vomiting:  1. The medication in the patch is effective for 72 hours, after which it should be removed.  Wrap patch in a tissue and discard in the trash. Wash hands thoroughly with soap and water. 2. You may remove the patch earlier than 72 hours if you experience unpleasant side effects which may include dry mouth, dizziness or visual disturbances. 3. Avoid touching the patch. Wash your hands with soap and water after contact with the patch.  Regional Anesthesia Blocks  1. Numbness or the inability to move the "blocked" extremity may last from 3-48 hours after placement. The length of time depends on the medication injected and your individual response to the medication. If the numbness is not going away after 48 hours, call your surgeon.  2. The extremity that is blocked will need to be protected until the numbness is gone and the  Strength has returned. Because you cannot feel it, you will need to take extra care to avoid injury. Because it may be weak, you may have difficulty moving it or using it. You may not know what position it is in without looking at it while the block is in effect.  3. For blocks in the legs and feet, returning to weight bearing and walking needs to be done carefully. You will need to wait until the numbness is entirely gone and the strength has returned. You should be able to move your leg  and foot normally before you try and bear weight or walk. You will need someone to be with you when you first try to ensure you do not fall and possibly risk injury.  4. Bruising and tenderness at the needle site are common side effects and will resolve in a few days.  5. Persistent numbness or new problems with movement should be communicated to the surgeon or the [place] [phone] [place] [phone]).       Next dose of Tylenol can be given at 4:15pm if needed. Oxycodone given at 3:27.

## 2021-09-29 NOTE — Anesthesia Procedure Notes (Signed)
Anesthesia Regional Block: Adductor canal block   Pre-Anesthetic Checklist: , timeout performed,  Correct Patient, Correct Site, Correct Laterality,  Correct Procedure, Correct Position, site marked,  Risks and benefits discussed,  Surgical consent,  Pre-op evaluation,  At surgeon's request and post-op pain management  Laterality: Left  Prep: chloraprep       Needles:  Injection technique: Single-shot  Needle Type: Echogenic Stimulator Needle     Needle Length: 10cm  Needle Gauge: 20     Additional Needles:   Procedures:,,,, ultrasound used (permanent image in chart),,    Narrative:  Start time: 09/29/2021 10:35 AM End time: 09/29/2021 10:40 AM Injection made incrementally with aspirations every 5 mL.  Performed by: Personally  Anesthesiologist: Mellody Dance, MD  Additional Notes: Functioning IV was confirmed and monitors were applied.  Sterile prep and drape,hand hygiene and sterile gloves were used. Ultrasound guidance: relevant anatomy identified, needle position confirmed, local anesthetic spread visualized around nerve(s)., vascular puncture avoided. Negative aspiration and negative test dose prior to incremental administration of local anesthetic. The patient tolerated the procedure well.

## 2021-09-29 NOTE — Anesthesia Preprocedure Evaluation (Addendum)
Anesthesia Evaluation  Patient identified by MRN, date of birth, ID band Patient awake    Reviewed: Allergy & Precautions, NPO status , Patient's Chart, lab work & pertinent test results  Airway Mallampati: I  TM Distance: >3 FB Neck ROM: Full    Dental no notable dental hx.    Pulmonary Current Smoker,    Pulmonary exam normal breath sounds clear to auscultation       Cardiovascular Exercise Tolerance: Good negative cardio ROS Normal cardiovascular exam Rhythm:Regular Rate:Normal     Neuro/Psych negative neurological ROS  negative psych ROS   GI/Hepatic negative GI ROS, Neg liver ROS,   Endo/Other  negative endocrine ROS  Renal/GU negative Renal ROS  negative genitourinary   Musculoskeletal negative musculoskeletal ROS (+)   Abdominal   Peds negative pediatric ROS (+)  Hematology negative hematology ROS (+)   Anesthesia Other Findings ACL tear  Reproductive/Obstetrics negative OB ROS                            Anesthesia Physical Anesthesia Plan  ASA: 2  Anesthesia Plan: General and Regional   Post-op Pain Management: Regional block*   Induction: Intravenous  PONV Risk Score and Plan: 1 and Treatment may vary due to age or medical condition, Midazolam, Ondansetron and Dexamethasone  Airway Management Planned: LMA  Additional Equipment: None  Intra-op Plan:   Post-operative Plan: Extubation in OR  Informed Consent: I have reviewed the patients History and Physical, chart, labs and discussed the procedure including the risks, benefits and alternatives for the proposed anesthesia with the patient or authorized representative who has indicated his/her understanding and acceptance.     Dental advisory given  Plan Discussed with: CRNA, Anesthesiologist and Surgeon  Anesthesia Plan Comments:        Anesthesia Quick Evaluation

## 2021-09-29 NOTE — Progress Notes (Signed)
Assisted Dr. Bass with left, popliteal, ultrasound guided block. Side rails up, monitors on throughout procedure. See vital signs in flow sheet. Tolerated Procedure well. 

## 2021-09-29 NOTE — Anesthesia Postprocedure Evaluation (Signed)
Anesthesia Post Note  Patient: Keith Frank  Procedure(s) Performed: LEFT KNEE ANTERIOR CRUCIATE LIGAMENT RECONSTRUCTION, PARTIAL MEDIAL MENISCECTOMY (Left: Knee)     Patient location during evaluation: PACU Anesthesia Type: Regional and General Level of consciousness: sedated Pain management: pain level controlled Vital Signs Assessment: post-procedure vital signs reviewed and stable Respiratory status: spontaneous breathing and respiratory function stable Cardiovascular status: stable Postop Assessment: no apparent nausea or vomiting Anesthetic complications: no   No notable events documented.  Last Vitals:  Vitals:   09/29/21 1500 09/29/21 1511  BP: (!) 133/93 131/87  Pulse: 84 77  Resp: 12 12  Temp:    SpO2: 95% 98%    Last Pain:  Vitals:   09/29/21 1456  PainSc: 4                  Candra R Kaitlin Ardito

## 2021-09-30 ENCOUNTER — Encounter (HOSPITAL_BASED_OUTPATIENT_CLINIC_OR_DEPARTMENT_OTHER): Payer: Self-pay | Admitting: Orthopaedic Surgery

## 2021-10-08 ENCOUNTER — Encounter: Payer: Self-pay | Admitting: Orthopaedic Surgery

## 2021-10-08 ENCOUNTER — Telehealth: Payer: Self-pay | Admitting: Orthopaedic Surgery

## 2021-10-08 ENCOUNTER — Ambulatory Visit (INDEPENDENT_AMBULATORY_CARE_PROVIDER_SITE_OTHER): Payer: Self-pay | Admitting: Orthopaedic Surgery

## 2021-10-08 DIAGNOSIS — S83512A Sprain of anterior cruciate ligament of left knee, initial encounter: Secondary | ICD-10-CM

## 2021-10-08 DIAGNOSIS — S83242A Other tear of medial meniscus, current injury, left knee, initial encounter: Secondary | ICD-10-CM

## 2021-10-08 MED ORDER — IBUPROFEN 800 MG PO TABS: 800.0000 mg | ORAL_TABLET | Freq: Three times a day (TID) | ORAL | 2 refills | Status: AC | PRN

## 2021-10-08 MED ORDER — OXYCODONE-ACETAMINOPHEN 5-325 MG PO TABS: 1.0000 | ORAL_TABLET | Freq: Two times a day (BID) | ORAL | 0 refills | Status: DC | PRN

## 2021-10-08 NOTE — Telephone Encounter (Signed)
Patient called. He would like his medication called in to [place], [place]

## 2021-10-08 NOTE — Progress Notes (Signed)
   Post-Op Visit Note   Patient: Keith Frank           Date of Birth: 05-10-1990           MRN: [id number] Visit Date: 10/08/2021 PCP: Patient, No Pcp Per (Inactive)   Assessment & Plan:  Chief Complaint:  Chief Complaint  Patient presents with   Left Knee - Follow-up    ACL reconstruction 09/29/2021   Visit Diagnoses:  1. Acute medial meniscus tear of left knee, initial encounter   2. New ACL tear, left, initial encounter     Plan: Keith Frank is 1 week status post left ACL reconstruction with quadriceps autograft.  Doing well overall.  No real complaints other than the swelling and discomfort at night.  Examination of left knee shows intact surgical incisions.  Expected postoperative swelling, effusion, bruising.  No neurovascular compromise.  Gentle range of motion is well-tolerated.  Graft feels stable.  Percocet and ibuprofen refilled.  We will place him in a playmaker knee brace unlocked.  Referral to outpatient PT.  We will also order CPM for him.  Recheck in 3 weeks.  Follow-Up Instructions: Return in about 3 weeks (around 10/29/2021).   Orders:  Orders Placed This Encounter  Procedures   Ambulatory referral to Physical Therapy   Meds ordered this encounter  Medications   oxyCODONE-acetaminophen (PERCOCET) 5-325 MG tablet    Sig: Take 1-2 tablets by mouth 2 (two) times daily as needed for severe pain.    Dispense:  20 tablet    Refill:  0   ibuprofen (ADVIL) 800 MG tablet    Sig: Take 1 tablet (800 mg total) by mouth every 8 (eight) hours as needed.    Dispense:  30 tablet    Refill:  2    Imaging: No results found.  PMFS History: Patient Active Problem List   Diagnosis Date Noted   Acute medial meniscus tear of left knee 08/26/2021   New ACL tear, left, initial encounter 07/13/2021   History reviewed. No pertinent past medical history.  No family history on file.  Past Surgical History:  Procedure Laterality Date   KNEE ARTHROSCOPY WITH ANTERIOR CRUCIATE  LIGAMENT (ACL) REPAIR Left 09/29/2021   Procedure: LEFT KNEE ANTERIOR CRUCIATE LIGAMENT RECONSTRUCTION, PARTIAL MEDIAL MENISCECTOMY;  Surgeon: Tarry Kos, MD;  Location: [facility];  Service: Orthopedics;  Laterality: Left;   Social History   Occupational History   Not on file  Tobacco Use   Smoking status: Every Day   Smokeless tobacco: Never  Vaping Use   Vaping Use: Never used  Substance and Sexual Activity   Alcohol use: Yes    Comment: occ   Drug use: No   Sexual activity: Not on file

## 2021-10-08 NOTE — Telephone Encounter (Signed)
Nevermind. Pt is going to pick up medication from [facility] but would like to use this pharm for future scripts

## 2021-10-11 ENCOUNTER — Ambulatory Visit: Payer: Self-pay | Admitting: Physical Therapy

## 2021-10-11 NOTE — Therapy (Deleted)
OUTPATIENT PHYSICAL THERAPY LOWER EXTREMITY EVALUATION   Patient Name: Keith Frank MRN: [id number] DOB:02-13-1991, 31 y.o., male Today's Date: 10/11/2021    No past medical history on file. Past Surgical History:  Procedure Laterality Date   KNEE ARTHROSCOPY WITH ANTERIOR CRUCIATE LIGAMENT (ACL) REPAIR Left 09/29/2021   Procedure: LEFT KNEE ANTERIOR CRUCIATE LIGAMENT RECONSTRUCTION, PARTIAL MEDIAL MENISCECTOMY;  Surgeon: Tarry Kos, MD;  Location: [facility];  Service: Orthopedics;  Laterality: Left;   Patient Active Problem List   Diagnosis Date Noted   Acute medial meniscus tear of left knee 08/26/2021   New ACL tear, left, initial encounter 07/13/2021    PCP: ***  REFERRING PROVIDER: ***  REFERRING DIAG: ***  THERAPY DIAG:  No diagnosis found.  Rationale for Evaluation and Treatment {HABREHAB:27488}  ONSET DATE: ***  SUBJECTIVE:   SUBJECTIVE STATEMENT: ***  PERTINENT HISTORY: ***  PAIN:  Are you having pain? {OPRCPAIN:27236}  PRECAUTIONS: {Therapy precautions:24002}  WEIGHT BEARING RESTRICTIONS {Yes ***/No:24003}  FALLS:  Has patient fallen in last 6 months? {fallsyesno:27318}  LIVING ENVIRONMENT: Lives with: {[facility] lives with:25569::"lives with their family"} Lives in: {Lives in:25570} Stairs: {opstairs:27293} Has following equipment at home: {Assistive devices:23999}  OCCUPATION: ***  PLOF: {PLOF:24004}  PATIENT GOALS ***   OBJECTIVE:   DIAGNOSTIC FINDINGS: ***  PATIENT SURVEYS:  {rehab surveys:24030}  COGNITION:  Overall cognitive status: {cognition:24006}     SENSATION: {sensation:27233}  EDEMA:  {edema:24020}  MUSCLE LENGTH: Hamstrings: Right *** deg; Left *** deg Thomas test: Right *** deg; Left *** deg  POSTURE: {posture:25561}  PALPATION: ***  LOWER EXTREMITY ROM:  {AROM/PROM:27142} ROM Right eval Left eval  Hip flexion    Hip extension    Hip abduction    Hip adduction    Hip internal  rotation    Hip external rotation    Knee flexion    Knee extension    Ankle dorsiflexion    Ankle plantarflexion    Ankle inversion    Ankle eversion     (Blank rows = not tested)  LOWER EXTREMITY MMT:  MMT Right eval Left eval  Hip flexion    Hip extension    Hip abduction    Hip adduction    Hip internal rotation    Hip external rotation    Knee flexion    Knee extension    Ankle dorsiflexion    Ankle plantarflexion    Ankle inversion    Ankle eversion     (Blank rows = not tested)  LOWER EXTREMITY SPECIAL TESTS:  {LEspecialtests:26242}  FUNCTIONAL TESTS:  {Functional tests:24029}  GAIT: Distance walked: *** Assistive device utilized: {Assistive devices:23999} Level of assistance: {Levels of assistance:24026} Comments: ***    TODAY'S TREATMENT: 10/11/2021:   HEP instruction/performance c cues for techniques, handout provided.  Trial set performed of each for comprehension and symptom assessment.  See below for exercise list.   PATIENT EDUCATION:  Education details: PT POC, HEP Person educated: {Person educated:25204} Education method: {Education Method:25205} Education comprehension: {Education Comprehension:25206}   HOME EXERCISE PROGRAM: *  ASSESSMENT:  CLINICAL IMPRESSION: Patient is a 31  y.o. who comes to clinic with complaints of *** pain with mobility, strength and movement coordination deficits that impair their ability to perform usual daily and recreational functional activities without increase difficulty/symptoms.  Patient to benefit from skilled PT services to address impairments and limitations to improve to previous level of function without restriction secondary to condition.    OBJECTIVE IMPAIRMENTS {opptimpairments:25111}.   ACTIVITY LIMITATIONS {activitylimitations:27494}  PARTICIPATION LIMITATIONS: {participationrestrictions:25113}  PERSONAL FACTORS {Personal factors:25162} are also affecting patient's functional outcome.    REHAB POTENTIAL: {rehabpotential:25112}  CLINICAL DECISION MAKING: {clinical decision making:25114}  EVALUATION COMPLEXITY: {Evaluation complexity:25115}   GOALS: Goals reviewed with patient? {yes/no:20286}  SHORT TERM GOALS: Target date: {follow up:25551}  Patient will demonstrate independent use of initial home exercise program to maintain progress from in clinic treatments. Goal status: New   Long term PT goals (target dates for all long term goals are *** weeks  *** ) Patient will demonstrate/report pain at worst less than or equal to 2/10 to facilitate minimal limitation in daily activity secondary to pain symptoms. Goal status: New   Patient will demonstrate independent use of home exercise program to facilitate ability to maintain/progress functional gains from skilled physical therapy services. Goal status: New   Patient will demonstrate FOTO outcome > or = *** % to indicate reduced disability due to condition. Goal status: New   *** Goal status: New       5.  ***  Goal status: New  PLAN: PT FREQUENCY: {rehab frequency:25116}  PT DURATION: {rehab duration:25117}  PLANNED INTERVENTIONS: {rehab planned interventions:25118::"Therapeutic exercises","Therapeutic activity","Neuromuscular re-education","Balance training","Gait training","Patient/Family education","Joint mobilization"}  PLAN FOR NEXT SESSION: ***   Sharmon Leyden, PT, MPT 10/11/2021, 8:44 AM

## 2021-10-14 ENCOUNTER — Ambulatory Visit (INDEPENDENT_AMBULATORY_CARE_PROVIDER_SITE_OTHER): Payer: Self-pay | Admitting: Physical Therapy

## 2021-10-14 ENCOUNTER — Other Ambulatory Visit: Payer: Self-pay

## 2021-10-14 ENCOUNTER — Encounter: Payer: Self-pay | Admitting: Physical Therapy

## 2021-10-14 DIAGNOSIS — M6281 Muscle weakness (generalized): Secondary | ICD-10-CM

## 2021-10-14 DIAGNOSIS — M25562 Pain in left knee: Secondary | ICD-10-CM

## 2021-10-14 DIAGNOSIS — M25662 Stiffness of left knee, not elsewhere classified: Secondary | ICD-10-CM

## 2021-10-14 DIAGNOSIS — R600 Localized edema: Secondary | ICD-10-CM

## 2021-10-14 DIAGNOSIS — R2681 Unsteadiness on feet: Secondary | ICD-10-CM

## 2021-10-14 DIAGNOSIS — R2689 Other abnormalities of gait and mobility: Secondary | ICD-10-CM

## 2021-10-14 NOTE — Therapy (Signed)
OUTPATIENT PHYSICAL THERAPY LOWER EXTREMITY EVALUATION   Patient Name: Keith Frank MRN: [id number] DOB:03/10/1991, 31 y.o., male Today's Date: 10/14/2021   PT End of Session - 10/14/21 1122     Visit Number 1    Number of Visits 16    Date for PT Re-Evaluation 12/09/21    Authorization Type Self Pay (applying for financial assistance)    PT Start Time 1038    PT Stop Time 1115    PT Time Calculation (min) 37 min    Activity Tolerance Patient tolerated treatment well    Behavior During Therapy [facility] for tasks assessed/performed             History reviewed. No pertinent past medical history. Past Surgical History:  Procedure Laterality Date   KNEE ARTHROSCOPY WITH ANTERIOR CRUCIATE LIGAMENT (ACL) REPAIR Left 09/29/2021   Procedure: LEFT KNEE ANTERIOR CRUCIATE LIGAMENT RECONSTRUCTION, PARTIAL MEDIAL MENISCECTOMY;  Surgeon: Tarry Kos, MD;  [facility];  Service: Orthopedics;  Laterality: Left;   Patient Active Problem List   Diagnosis Date Noted   Acute medial meniscus tear of left knee 08/26/2021   New ACL tear, left, initial encounter 07/13/2021    PCP: No PCP  REFERRING PROVIDER: Tarry Kos, MD   REFERRING DIAG: S83.242A (ICD-10-CM) - Acute medial meniscus tear of left knee, initial encounter S83.512A (ICD-10-CM) - New ACL tear, left, initial encounter   THERAPY DIAG:  Acute pain of left knee - Plan: PT plan of care cert/re-cert  Stiffness of left knee, not elsewhere classified - Plan: PT plan of care cert/re-cert  Localized edema - Plan: PT plan of care cert/re-cert  Muscle weakness (generalized) - Plan: PT plan of care cert/re-cert  Other abnormalities of gait and mobility - Plan: PT plan of care cert/re-cert  Unsteadiness on feet - Plan: PT plan of care cert/re-cert  Rationale for Evaluation and Treatment Rehabilitation  ONSET DATE: 09/29/21  SUBJECTIVE:   SUBJECTIVE STATEMENT: Pt is s/p Lt ACL repair and medial  meniscectomy on 09/29/21.  Initial injury occurred 06/09/20 when snowboarding and pt deferred surgery until now. He c/o difficulty with stairs at this time, but feels the past 3-4 days have really improved mobility.  PERTINENT HISTORY: None  PAIN:  Are you having pain? Yes: NPRS scale: 0 currently, up to 6-7/10 Pain location: Lt knee Pain description: throbbing Aggravating factors: bending, increased activity, lateral movement Relieving factors: avoiding provoking positions, medication  PRECAUTIONS: Other: ACL protocol  WEIGHT BEARING RESTRICTIONS No  FALLS:  Has patient fallen in last 6 months? No  LIVING ENVIRONMENT: Lives with:  roommates Lives in: House/apartment Stairs: No; does have spiral staircase to get down to laundry Has following equipment at home: None  OCCUPATION: unemployed currently  PLOF: Independent and Leisure: soccer, snowboarding, gaming  PATIENT GOALS regain use of LLE, improve pain, wants to play soccer and snowboard again   OBJECTIVE:   PATIENT SURVEYS:   10/14/21: FOTO 54 (predicted 77)  COGNITION:  Overall cognitive status: Within functional limits for tasks assessed     SENSATION: WFL  EDEMA:  10/14/21:Circumferential: Lt knee joint line: 36 cm; Rt knee joint line: 35 cm  POSTURE: No Significant postural limitations  LOWER EXTREMITY ROM:  Active ROM Right eval Left eval  Knee flexion 147 76  Knee extension 3 -19 (seated LAQ)   (Blank rows = not tested)   LOWER EXTREMITY ROM:  Passive ROM Right eval Left eval  Knee flexion  86  Knee extension  -  2   (Blank rows = not tested)  LOWER EXTREMITY MMT:  10/14/21: Not formally tested due to post op status Thigh Girth (6" superior to patella): Lt 40 cm; Rt 43.5 cm  GAIT: 10/14/21:   Distance walked: 100' Level of assistance: Modified independence Comments: antalgic gait with decreased hip/knee flexion on Lt; amb with unlocked hinged knee brace    TODAY'S  TREATMENT: 10/14/21 See HEP - performed trial reps PRN for comprehension   PATIENT EDUCATION:  Education details: HEP Person educated: Patient Education method: Programmer, multimedia, Demonstration, and Handouts Education comprehension: verbalized understanding, returned demonstration, and needs further education   HOME EXERCISE PROGRAM: Access Code: [email or web address] URL: [email or web address] Date: 10/14/2021 Prepared by: Moshe Cipro  Exercises - Quad Setting and Stretching  - 8-10 x daily - 7 x weekly - 1 sets - 10 reps - 5 sec hold - Supine Heel Slide with Strap  - 8-10 x daily - 7 x weekly - 1 sets - 10 reps - Small Range Straight Leg Raise  - 2-3 x daily - 7 x weekly - 1-2 sets - 10 reps - Sidelying Hip Abduction  - 2-3 x daily - 7 x weekly - 1-2 sets - 10 reps - Supine Knee Extension Mobilization with Weight  - 3-5 x daily - 7 x weekly - 1 sets - 1 reps - 3-5 min hold  ASSESSMENT:  CLINICAL IMPRESSION: Patient is a 31 y.o. male who was seen today for physical therapy evaluation and treatment for Lt ACL repair with medial meniscectomy.  Pt demonstrates decreased strength and ROM, as well as gait abnormalities, decreased balance and expected post-op pain affecting functional mobility.  He will benefit from PT to address deficits listed.    OBJECTIVE IMPAIRMENTS decreased activity tolerance, decreased balance, decreased mobility, difficulty walking, decreased ROM, decreased strength, increased edema, increased fascial restrictions, increased muscle spasms, and pain.   ACTIVITY LIMITATIONS carrying, lifting, bending, standing, squatting, sleeping, stairs, transfers, and locomotion level  PARTICIPATION LIMITATIONS: cleaning, laundry, driving, shopping, community activity, and sports  PERSONAL FACTORS Time since onset of injury/illness/exacerbation are also affecting patient's functional outcome.   REHAB POTENTIAL: Good  CLINICAL DECISION MAKING:  Stable/uncomplicated  EVALUATION COMPLEXITY: Low    GOALS: Goals reviewed with patient? Yes  SHORT TERM GOALS: Target date: 11/11/2021  Independent with initial HEP Goal status: INITIAL  2.  Improve Lt knee AROM 0-100 for improved function Goal status: INITIAL  3.  Report pain < 4/10 with ROM activities and ambulation Goal status: INITIAL  LONG TERM GOALS: Target date: 12/09/2021  Independent with final HEP Goal status: INITIAL  2.  FOTO score improved to 77 Goal status: INITIAL  3.  Lt knee AROM improved to 0-140 for improved function and mobility Goal status: INIITAL  4.  Report pain < 2/10 with activity for improved function Goal status: INITIAL  5.  Lt thigh girth within 1cm of Rt thigh for improved LLE strength Goal status: INITIAL  6.  Amb without device or deviations for improved mobility Goal status: INITIAL    PLAN: PT FREQUENCY: 2x/week  PT DURATION: 8 weeks  PLANNED INTERVENTIONS: Therapeutic exercises, Therapeutic activity, Neuromuscular re-education, Balance training, Gait training, Patient/Family education, Joint mobilization, Stair training, Aquatic Therapy, Dry Needling, Electrical stimulation, Cryotherapy, Moist heat, Taping, Vasopneumatic device, Ultrasound, Manual therapy, and Re-evaluation  PLAN FOR NEXT SESSION: review HEP, needs ROM work and quad activation focus, initiate BFR (discussed today)     Clarita Crane, PT, DPT 10/14/21 11:24 AM

## 2021-10-19 ENCOUNTER — Ambulatory Visit (INDEPENDENT_AMBULATORY_CARE_PROVIDER_SITE_OTHER): Payer: Self-pay | Admitting: Rehabilitative and Restorative Service Providers"

## 2021-10-19 ENCOUNTER — Other Ambulatory Visit: Payer: Self-pay | Admitting: Physician Assistant

## 2021-10-19 ENCOUNTER — Encounter: Payer: Self-pay | Admitting: Rehabilitative and Restorative Service Providers"

## 2021-10-19 ENCOUNTER — Telehealth: Payer: Self-pay | Admitting: Orthopaedic Surgery

## 2021-10-19 DIAGNOSIS — R2681 Unsteadiness on feet: Secondary | ICD-10-CM

## 2021-10-19 DIAGNOSIS — R2689 Other abnormalities of gait and mobility: Secondary | ICD-10-CM

## 2021-10-19 DIAGNOSIS — M25562 Pain in left knee: Secondary | ICD-10-CM

## 2021-10-19 DIAGNOSIS — M6281 Muscle weakness (generalized): Secondary | ICD-10-CM

## 2021-10-19 DIAGNOSIS — M25662 Stiffness of left knee, not elsewhere classified: Secondary | ICD-10-CM

## 2021-10-19 DIAGNOSIS — R600 Localized edema: Secondary | ICD-10-CM

## 2021-10-19 MED ORDER — OXYCODONE-ACETAMINOPHEN 5-325 MG PO TABS: 1.0000 | ORAL_TABLET | Freq: Two times a day (BID) | ORAL | 0 refills | Status: AC | PRN

## 2021-10-19 NOTE — Therapy (Signed)
OUTPATIENT PHYSICAL THERAPY TREATMENT NOTE   Patient Name: Keith Frank MRN: [id number] DOB:March 19, 1991, 31 y.o., male Today's Date: 10/19/2021  PCP: none REFERRING PROVIDER: Dr. Roda Shutters, MD  END OF SESSION:   PT End of Session - 10/19/21 1203     Visit Number 2    Number of Visits 16    Date for PT Re-Evaluation 12/09/21    Authorization Type Self Pay (applying for financial assistance)    PT Start Time 1104    PT Stop Time 1158    PT Time Calculation (min) 54 min    Activity Tolerance Patient tolerated treatment well;Other (comment)   patient limited by fatigue   Behavior During Therapy [facility] for tasks assessed/performed             History reviewed. No pertinent past medical history. Past Surgical History:  Procedure Laterality Date   KNEE ARTHROSCOPY WITH ANTERIOR CRUCIATE LIGAMENT (ACL) REPAIR Left 09/29/2021   Procedure: LEFT KNEE ANTERIOR CRUCIATE LIGAMENT RECONSTRUCTION, PARTIAL MEDIAL MENISCECTOMY;  Surgeon: Tarry Kos, MD;  Location: [facility];  Service: Orthopedics;  Laterality: Left;   Patient Active Problem List   Diagnosis Date Noted   Acute medial meniscus tear of left knee 08/26/2021   New ACL tear, left, initial encounter 07/13/2021    REFERRING DIAG: L39.030S (ICD-10-CM) - Acute medial meniscus tear of left knee, initial encounter S83.512A (ICD-10-CM) - New ACL tear, left, initial encounter   SURGERY DATE 09/29/21  THERAPY DIAG:  Acute pain of left knee  Stiffness of left knee, not elsewhere classified  Localized edema  Muscle weakness (generalized)  Other abnormalities of gait and mobility  Unsteadiness on feet  Rationale for Evaluation and Treatment Rehabilitation  PERTINENT HISTORY: none  PRECAUTIONS: Other: ACL protocol  SUBJECTIVE: I am doing pretty good. Little sore; not bad. Doing my exercises at home.  Pt is s/p Lt ACL repair and medial meniscectomy on 09/29/21.  Initial injury occurred 06/09/20 when snowboarding and  pt deferred surgery until now. He c/o difficulty with stairs at this time, but feels the past 3-4 days have really improved mobility.  PAIN:  Are you having pain? Yes: NPRS scale: 3/10 Pain location: Lt knee Pain description: throbbing Aggravating factors: bending, increased activity, lateral movement Relieving factors: avoiding provoking positions, medication   OBJECTIVE:    PATIENT SURVEYS:   10/14/21: FOTO 54 (predicted 77)   COGNITION:           Overall cognitive status: Within functional limits for tasks assessed                          SENSATION: WFL   EDEMA:  10/14/21:Circumferential: Lt knee joint line: 36 cm; Rt knee joint line: 35 cm   POSTURE: No Significant postural limitations   LOWER EXTREMITY ROM:   Active ROM Right eval Left eval  Knee flexion 147 76  Knee extension 3 -19 (seated LAQ)   (Blank rows = not tested)     LOWER EXTREMITY ROM:   Passive ROM Right eval Left eval  Knee flexion   86  Knee extension   -2   (Blank rows = not tested)   LOWER EXTREMITY MMT:   10/14/21: Not formally tested due to post op status Thigh Girth (6" superior to patella): Lt 40 cm; Rt 43.5 cm   GAIT: 10/14/21:   Distance walked: 100' Level of assistance: Modified independence Comments: antalgic gait with decreased hip/knee flexion on Lt; amb  with unlocked hinged knee brace       TODAY'S TREATMENT:  [facility] Adult PT Treatment:                                                DATE: 10/19/2021 Therapeutic Exercise: Exercises c BFR set at 75% LOP (LOP 170 mmHg, exercise 127 mmHg)   Supine Lt quad set 5 sec hold 2 mins, 30 sec rest break, 2 mins   Seated LAQ to fatigue x22, x 15, x 15, x 15 c 30 sec rest breaks after sets  Supine quad set c SLR Lt x 6 (fatigue noted, mild anterior knee joint symptoms)  Reviewed heel slide with strap and sidelying hip abdct with PT verbal cues for body position and not performing hip hike with heel slide and keeping body in alignment with  hip abdct.    Modalities: Vaso 34 degrees moderate compression x 10 min L knee elevated on flat bolster. Discussed that pt may have muscle soreness after treatment and to ice for 10-15 min as needed and to take a leisurely walk to help work out muscle soreness.   10/14/21 See HEP - performed trial reps PRN for comprehension     PATIENT EDUCATION:  Education details: HEP Person educated: Patient Education method: Explanation, Demonstration, and Handouts Education comprehension: verbalized understanding, returned demonstration, and needs further education     HOME EXERCISE PROGRAM: Access Code: [email or web address] URL: [email or web address] Date: 10/14/2021 Prepared by: Moshe Cipro   Exercises - Quad Setting and Stretching  - 8-10 x daily - 7 x weekly - 1 sets - 10 reps - 5 sec hold - Supine Heel Slide with Strap  - 8-10 x daily - 7 x weekly - 1 sets - 10 reps - Small Range Straight Leg Raise  - 2-3 x daily - 7 x weekly - 1-2 sets - 10 reps - Sidelying Hip Abduction  - 2-3 x daily - 7 x weekly - 1-2 sets - 10 reps - Supine Knee Extension Mobilization with Weight  - 3-5 x daily - 7 x weekly - 1 sets - 1 reps - 3-5 min hold   ASSESSMENT:   CLINICAL IMPRESSION: Patient is a 31 y.o. male who was seen today for physical therapy  treatment for Lt ACL repair with medial meniscectomy.  Pt demonstrates decreased strength and ROM, as well as gait abnormalities, decreased balance and expected post-op pain affecting functional mobility.  Pt tolerated treatment well but fatigued with BFR. Continue to treat.      OBJECTIVE IMPAIRMENTS decreased activity tolerance, decreased balance, decreased mobility, difficulty walking, decreased ROM, decreased strength, increased edema, increased fascial restrictions, increased muscle spasms, and pain.    ACTIVITY LIMITATIONS carrying, lifting, bending, standing, squatting, sleeping, stairs, transfers, and locomotion level   PARTICIPATION  LIMITATIONS: cleaning, laundry, driving, shopping, community activity, and sports   PERSONAL FACTORS Time since onset of injury/illness/exacerbation are also affecting patient's functional outcome.    REHAB POTENTIAL: Good   CLINICAL DECISION MAKING: Stable/uncomplicated   EVALUATION COMPLEXITY: Low       GOALS: Goals reviewed with patient? Yes   SHORT TERM GOALS: Target date: 11/11/2021   Independent with initial HEP Goal status: INITIAL   2.  Improve Lt knee AROM 0-100 for improved function Goal status: INITIAL   3.  Report pain < 4/10 with ROM activities and ambulation Goal  status: INITIAL   LONG TERM GOALS: Target date: 12/09/2021   Independent with final HEP Goal status: INITIAL   2.  FOTO score improved to 77 Goal status: INITIAL   3.  Lt knee AROM improved to 0-140 for improved function and mobility Goal status: INIITAL   4.  Report pain < 2/10 with activity for improved function Goal status: INITIAL   5.  Lt thigh girth within 1cm of Rt thigh for improved LLE strength Goal status: INITIAL   6.  Amb without device or deviations for improved mobility Goal status: INITIAL       PLAN: PT FREQUENCY: 2x/week   PT DURATION: 8 weeks   PLANNED INTERVENTIONS: Therapeutic exercises, Therapeutic activity, Neuromuscular re-education, Balance training, Gait training, Patient/Family education, Joint mobilization, Stair training, Aquatic Therapy, Dry Needling, Electrical stimulation, Cryotherapy, Moist heat, Taping, Vasopneumatic device, Ultrasound, Manual therapy, and Re-evaluation   PLAN FOR NEXT SESSION:  needs ROM work and quad activation focus, how did he do with BFR and continue if applicable     Luna Fuse, PT 10/19/2021, 12:06 PM  [facility] [address] [place], [place], [address] Phone: [phone]   Fax:  [phone]

## 2021-10-19 NOTE — Telephone Encounter (Signed)
Sent in

## 2021-10-19 NOTE — Telephone Encounter (Signed)
Keith Frank is requesting a medication refill for oxycodone for pain. Please advise

## 2021-10-19 NOTE — Telephone Encounter (Signed)
Called and notified patient.

## 2021-10-21 ENCOUNTER — Encounter: Payer: Self-pay | Admitting: Rehabilitative and Restorative Service Providers"

## 2021-10-21 ENCOUNTER — Ambulatory Visit: Payer: Self-pay | Admitting: Rehabilitative and Restorative Service Providers"

## 2021-10-22 ENCOUNTER — Ambulatory Visit (INDEPENDENT_AMBULATORY_CARE_PROVIDER_SITE_OTHER): Payer: Self-pay | Admitting: Rehabilitative and Restorative Service Providers"

## 2021-10-22 ENCOUNTER — Encounter: Payer: Self-pay | Admitting: Rehabilitative and Restorative Service Providers"

## 2021-10-22 DIAGNOSIS — R600 Localized edema: Secondary | ICD-10-CM

## 2021-10-22 DIAGNOSIS — M25662 Stiffness of left knee, not elsewhere classified: Secondary | ICD-10-CM

## 2021-10-22 DIAGNOSIS — M6281 Muscle weakness (generalized): Secondary | ICD-10-CM

## 2021-10-22 DIAGNOSIS — R2689 Other abnormalities of gait and mobility: Secondary | ICD-10-CM

## 2021-10-22 DIAGNOSIS — R2681 Unsteadiness on feet: Secondary | ICD-10-CM

## 2021-10-22 DIAGNOSIS — M25562 Pain in left knee: Secondary | ICD-10-CM

## 2021-10-27 ENCOUNTER — Encounter: Payer: Self-pay | Admitting: Physical Therapy

## 2021-10-27 ENCOUNTER — Ambulatory Visit (INDEPENDENT_AMBULATORY_CARE_PROVIDER_SITE_OTHER): Payer: Self-pay | Admitting: Physical Therapy

## 2021-10-27 DIAGNOSIS — R600 Localized edema: Secondary | ICD-10-CM

## 2021-10-27 DIAGNOSIS — M25662 Stiffness of left knee, not elsewhere classified: Secondary | ICD-10-CM

## 2021-10-27 DIAGNOSIS — R2689 Other abnormalities of gait and mobility: Secondary | ICD-10-CM

## 2021-10-27 DIAGNOSIS — M25562 Pain in left knee: Secondary | ICD-10-CM

## 2021-10-27 DIAGNOSIS — M6281 Muscle weakness (generalized): Secondary | ICD-10-CM

## 2021-10-27 DIAGNOSIS — R2681 Unsteadiness on feet: Secondary | ICD-10-CM

## 2021-10-27 NOTE — Therapy (Signed)
OUTPATIENT PHYSICAL THERAPY TREATMENT NOTE   Patient Name: Keith Frank MRN: [id number] DOB:[birth year], 31 y.o., male Today's Date: 10/27/2021  PCP: none REFERRING PROVIDER: Dr. Roda Shutters, MD  END OF SESSION:   PT End of Session - 10/27/21 1432     Visit Number 4    Number of Visits 16    Date for PT Re-Evaluation 12/09/21    Authorization Type Self Pay (applying for financial assistance)    PT Start Time 1429    PT Stop Time 1521    PT Time Calculation (min) 52 min    Activity Tolerance Patient limited by fatigue    Behavior During Therapy [facility] for tasks assessed/performed               History reviewed. No pertinent past medical history. Past Surgical History:  Procedure Laterality Date   KNEE ARTHROSCOPY WITH ANTERIOR CRUCIATE LIGAMENT (ACL) REPAIR Left 09/29/2021   Procedure: LEFT KNEE ANTERIOR CRUCIATE LIGAMENT RECONSTRUCTION, PARTIAL MEDIAL MENISCECTOMY;  Surgeon: Tarry Kos, MD;  Location: [facility];  Service: Orthopedics;  Laterality: Left;   Patient Active Problem List   Diagnosis Date Noted   Acute medial meniscus tear of left knee 08/26/2021   New ACL tear, left, initial encounter 07/13/2021    REFERRING DIAG: U72.536U (ICD-10-CM) - Acute medial meniscus tear of left knee, initial encounter S83.512A (ICD-10-CM) - New ACL tear, left, initial encounter   SURGERY DATE 09/29/21  THERAPY DIAG:  Acute pain of left knee  Stiffness of left knee, not elsewhere classified  Localized edema  Muscle weakness (generalized)  Other abnormalities of gait and mobility  Unsteadiness on feet  Rationale for Evaluation and Treatment Rehabilitation  PERTINENT HISTORY: none  PRECAUTIONS: Other: ACL protocol  SUBJECTIVE: doing well knee is just stiff   PAIN:  NPRS scale: 2/10 Pain location: Lt knee Pain description: throbbing Aggravating factors: bending, increased activity, lateral movement Relieving factors: avoiding provoking positions,  medication   OBJECTIVE:    PATIENT SURVEYS:  10/14/21: FOTO 54 (predicted 77)   COGNITION: 10/14/21      Overall cognitive status: Within functional limits for tasks assessed                          SENSATION: 6/15/23WFL   EDEMA:  10/14/21:Circumferential: Lt knee joint line: 36 cm; Rt knee joint line: 35 cm   POSTURE: No Significant postural limitations   LOWER EXTREMITY ROM:   Active ROM Right eval Left eval Left 10/22/2021 Left 10/27/21  Knee flexion 147 76 90 in AROM supine heel slide 96 (Supine heel slide)  Knee extension 3 -19 (seated LAQ)     (Blank rows = not tested)     LOWER EXTREMITY ROM:   Passive ROM Right eval Left eval Left 10/27/21  Knee flexion   86 104  Knee extension   -2    (Blank rows = not tested)   LOWER EXTREMITY MMT:   10/14/21: Not formally tested due to post op status Thigh Girth (6" superior to patella): Lt 40 cm; Rt 43.5 cm   GAIT: 10/14/21:  Distance walked: 100' Level of assistance: Modified independence Comments: antalgic gait with decreased hip/knee flexion on Lt; amb with unlocked hinged knee brace       TODAY'S TREATMENT: 10/27/21 Therex: NuStep L6 x 8 min (UE/LE for ROM) Supine AA knee extension: quad set with strap x 20 reps (5 sec hold) Long sitting knee flexion AA LLLD holds up  to 1-2 min Seated SLR x 10 reps - mod cues to decr extension lag      BFR (cuff size 3, LOP 170 mmHg, exercises at 127 mmHg): LAQ 30 reps; 15x3 reps with 3 sec breaks Sidelying Lt hip abduction 3x20 reps, 30 sec rest between sets  Modalities: Lt knee vaso x 10 min; mod pressure, 34 deg     10/22/2021 Therex: Nustep Lvl 6 9 mins UE/LE for Lt knee ROM to tolerance Supine heel slide AROM 5 sec hold /quad set 5 sec hold combo x 10 Lt leg  Supine SLR Lt x 5 (poor control, lacking 15-20 degrees in extension lag) Standing wall runner stretch for Lt leg posterior knee extension focus 15 sec x 5  Exercises c BFR set at 75% LOP (LOP 170 mmHg,  exercise 127 mmHg)   Supine Lt quad set 5 sec on/off 5 mins    Seated LAQ to fatigue x, x 15, x 15, x 15 c 30 sec rest breaks after sets   Review of heel prop for extension mobility gains.   Neuro re-ed:  Church pew anterior/posterior weight shift ankle strategy 3 mins   Manual  Lt knee flexion PROM,contract/relax to quad for mobility gains to tolerance    Modalities: Vaso 34 degrees moderate compression x 10 min L knee elevated on flat bolster. Discussed that pt may have muscle soreness after treatment and to ice for 10-15 min as needed and to take a leisurely walk to help work out muscle soreness.   10/19/2021 Therex: Exercises c BFR set at 75% LOP (LOP 170 mmHg, exercise 127 mmHg)   Supine Lt quad set 5 sec hold 2 mins, 30 sec rest break, 2 mins   Seated LAQ to fatigue x22, x 15, x 15, x 15 c 30 sec rest breaks after sets  Supine quad set c SLR Lt x 6 (fatigue noted, mild anterior knee joint symptoms)  Reviewed heel slide with strap and sidelying hip abdct with PT verbal cues for body position and not performing hip hike with heel slide and keeping body in alignment with hip abdct.    Modalities: Vaso 34 degrees moderate compression x 10 min L knee elevated on flat bolster. Discussed that pt may have muscle soreness after treatment and to ice for 10-15 min as needed and to take a leisurely walk to help work out muscle soreness.   10/14/21 See HEP - performed trial reps PRN for comprehension     PATIENT EDUCATION:  10/22/2021 Education details: HEP progression Person educated: Patient Education method: Explanation, Demonstration, and Handouts Education comprehension: verbalized understanding, returned demonstration, and needs further education     HOME EXERCISE PROGRAM: Access Code: [email or web address] URL: [email or web address] Date: 10/22/2021 Prepared by: Chyrel Masson  Exercises - Quad Setting and Stretching  - 8-10 x daily - 7 x weekly - 1 sets - 10 reps - 5  sec hold - Supine Heel Slide with Strap  - 8-10 x daily - 7 x weekly - 1 sets - 10 reps - Small Range Straight Leg Raise  - 2-3 x daily - 7 x weekly - 1-2 sets - 10 reps - Sidelying Hip Abduction  - 2-3 x daily - 7 x weekly - 1-2 sets - 10 reps - Supine Knee Extension Mobilization with Weight  - 3-5 x daily - 7 x weekly - 1 sets - 1 reps - 3-5 min hold - Church Pew  - 3-5 x daily - 7 x weekly - 1 sets -  1-2 mins hold - Gastroc Stretch on Wall  - 2-3 x daily - 7 x weekly - 1 sets - 3-5 reps - 15-30 hold   ASSESSMENT:   CLINICAL IMPRESSION: Pt tolerated session well with expected fatigue and discomfort with BFR exercises.  AROM improving at this time as well.  Will continue to benefit from PT to maximize function.  OBJECTIVE IMPAIRMENTS decreased activity tolerance, decreased balance, decreased mobility, difficulty walking, decreased ROM, decreased strength, increased edema, increased fascial restrictions, increased muscle spasms, and pain.    ACTIVITY LIMITATIONS carrying, lifting, bending, standing, squatting, sleeping, stairs, transfers, and locomotion level   PARTICIPATION LIMITATIONS: cleaning, laundry, driving, shopping, community activity, and sports   PERSONAL FACTORS Time since onset of injury/illness/exacerbation are also affecting patient's functional outcome.    REHAB POTENTIAL: Good   CLINICAL DECISION MAKING: Stable/uncomplicated   EVALUATION COMPLEXITY: Low       GOALS: Goals reviewed with patient? Yes   SHORT TERM GOALS: Target date: 11/11/2021   Independent with initial HEP Goal status: on going - assessed 10/22/2021   2.  Improve Lt knee AROM 0-100 for improved function Goal status: on going - assessed 10/22/2021   3.  Report pain < 4/10 with ROM activities and ambulation Goal status: on going - assessed 10/22/2021   LONG TERM GOALS: Target date: 12/09/2021   Independent with final HEP Goal status: INITIAL   2.  FOTO score improved to 77 Goal status:  INITIAL   3.  Lt knee AROM improved to 0-140 for improved function and mobility Goal status: INIITAL   4.  Report pain < 2/10 with activity for improved function Goal status: INITIAL   5.  Lt thigh girth within 1cm of Rt thigh for improved LLE strength Goal status: INITIAL   6.  Amb without device or deviations for improved mobility Goal status: INITIAL       PLAN: PT FREQUENCY: 2x/week   PT DURATION: 8 weeks   PLANNED INTERVENTIONS: Therapeutic exercises, Therapeutic activity, Neuromuscular re-education, Balance training, Gait training, Patient/Family education, Joint mobilization, Stair training, Aquatic Therapy, Dry Needling, Electrical stimulation, Cryotherapy, Moist heat, Taping, Vasopneumatic device, Ultrasound, Manual therapy, and Re-evaluation   PLAN FOR NEXT SESSION:  continue per ACL protocol, progress mobility, improve quad activiation .  Combo with BFR as tolerated.  Static balance interventions.       Clarita Crane, PT, DPT 10/27/21 3:14 PM     [facility] [address] [place], [place], [address] Phone: [phone]   Fax:  [phone]

## 2021-10-28 NOTE — Progress Notes (Signed)
   Post-Op Visit Note   Patient: Keith Frank           Date of Birth: [birth year]           MRN: [id number] Visit Date: 10/29/2021 PCP: Patient, No Pcp Per   Assessment & Plan:  Chief Complaint:  Chief Complaint  Patient presents with   Left Knee - Follow-up    ACL reconstruction 09/29/2021   Visit Diagnoses:  1. Acute medial meniscus tear of left knee, initial encounter   2. New ACL tear, left, initial encounter     Plan: Keith Frank is 4 weeks status post ACL and medial meniscal debridement.  He is doing physical therapy at our office twice a week.  He has discontinued the CPM.  No real complaints today.  Examination left knee shows fully healed surgical scars.  Is having some difficulty with full extension but he has a soft endpoint.  He can get to about 90 degrees of flexion for me today.  The graft feels stable.  Small joint effusion.  Keith Frank will continue with outpatient PT.  He can weight-bear as tolerated.  He should continue to wear the playmaker brace for couple weeks and then he can wean to compression sleeve if he feels comfortable doing so.  Recheck in 6 weeks.  Follow-Up Instructions: Return in about 6 weeks (around 12/10/2021).   Orders:  No orders of the defined types were placed in this encounter.  No orders of the defined types were placed in this encounter.   Imaging: No results found.  PMFS History: Patient Active Problem List   Diagnosis Date Noted   Acute medial meniscus tear of left knee 08/26/2021   New ACL tear, left, initial encounter 07/13/2021   History reviewed. No pertinent past medical history.  No family history on file.  Past Surgical History:  Procedure Laterality Date   KNEE ARTHROSCOPY WITH ANTERIOR CRUCIATE LIGAMENT (ACL) REPAIR Left 09/29/2021   Procedure: LEFT KNEE ANTERIOR CRUCIATE LIGAMENT RECONSTRUCTION, PARTIAL MEDIAL MENISCECTOMY;  Surgeon: Tarry Kos, MD;  Location: [facility];  Service: Orthopedics;  Laterality:  Left;   Social History   Occupational History   Not on file  Tobacco Use   Smoking status: Every Day   Smokeless tobacco: Never  Vaping Use   Vaping Use: Never used  Substance and Sexual Activity   Alcohol use: Yes    Comment: occ   Drug use: No   Sexual activity: Not on file

## 2021-10-29 ENCOUNTER — Ambulatory Visit (INDEPENDENT_AMBULATORY_CARE_PROVIDER_SITE_OTHER): Payer: Self-pay | Admitting: Rehabilitative and Restorative Service Providers"

## 2021-10-29 ENCOUNTER — Encounter: Payer: Self-pay | Admitting: Orthopaedic Surgery

## 2021-10-29 ENCOUNTER — Encounter: Payer: Self-pay | Admitting: Rehabilitative and Restorative Service Providers"

## 2021-10-29 ENCOUNTER — Ambulatory Visit (INDEPENDENT_AMBULATORY_CARE_PROVIDER_SITE_OTHER): Payer: Self-pay | Admitting: Orthopaedic Surgery

## 2021-10-29 DIAGNOSIS — M25562 Pain in left knee: Secondary | ICD-10-CM

## 2021-10-29 DIAGNOSIS — R2681 Unsteadiness on feet: Secondary | ICD-10-CM

## 2021-10-29 DIAGNOSIS — S83242A Other tear of medial meniscus, current injury, left knee, initial encounter: Secondary | ICD-10-CM

## 2021-10-29 DIAGNOSIS — M6281 Muscle weakness (generalized): Secondary | ICD-10-CM

## 2021-10-29 DIAGNOSIS — R2689 Other abnormalities of gait and mobility: Secondary | ICD-10-CM

## 2021-10-29 DIAGNOSIS — M25662 Stiffness of left knee, not elsewhere classified: Secondary | ICD-10-CM

## 2021-10-29 DIAGNOSIS — S83512A Sprain of anterior cruciate ligament of left knee, initial encounter: Secondary | ICD-10-CM

## 2021-10-29 DIAGNOSIS — R600 Localized edema: Secondary | ICD-10-CM

## 2021-10-29 MED ORDER — HYDROCODONE-ACETAMINOPHEN 5-325 MG PO TABS
1.0000 | ORAL_TABLET | Freq: Every day | ORAL | 0 refills | Status: AC | PRN
Start: 2021-10-29 — End: ?

## 2021-10-29 NOTE — Therapy (Signed)
[facility] [address] [place], [place], [address] Phone: [phone]   Fax:  [phone]  Patient Details  Name: Keith Frank MRN: [id number] Date of Birth: 04-21-1991 Referring Provider:  Tarry Kos, MD  Encounter Date: 10/29/2021   OUTPATIENT PHYSICAL THERAPY TREATMENT NOTE   Patient Name: Keith Frank MRN: [id number] DOB:1990-08-06, 31 y.o., male Today's Date: 10/29/2021  PCP: none REFERRING PROVIDER: Dr. Roda Shutters, MD  END OF SESSION:   PT End of Session - 10/29/21 1139     Visit Number 5    Number of Visits 16    Authorization Type Self Pay (applying for financial assistance)    PT Start Time 1105    PT Stop Time 1147    PT Time Calculation (min) 42 min    Activity Tolerance Patient tolerated treatment well;No increased pain;Patient limited by fatigue    Behavior During Therapy [facility] for tasks assessed/performed                History reviewed. No pertinent past medical history. Past Surgical History:  Procedure Laterality Date   KNEE ARTHROSCOPY WITH ANTERIOR CRUCIATE LIGAMENT (ACL) REPAIR Left 09/29/2021   Procedure: LEFT KNEE ANTERIOR CRUCIATE LIGAMENT RECONSTRUCTION, PARTIAL MEDIAL MENISCECTOMY;  Surgeon: Tarry Kos, MD;  Location: [facility];  Service: Orthopedics;  Laterality: Left;   Patient Active Problem List   Diagnosis Date Noted   Acute medial meniscus tear of left knee 08/26/2021   New ACL tear, left, initial encounter 07/13/2021    REFERRING DIAG: R44.315Q (ICD-10-CM) - Acute medial meniscus tear of left knee, initial encounter S83.512A (ICD-10-CM) - New ACL tear, left, initial encounter   SURGERY DATE 09/29/21  THERAPY DIAG:  Acute pain of left knee  Stiffness of left knee, not elsewhere classified  Localized edema  Muscle weakness (generalized)  Other abnormalities of gait and mobility  Unsteadiness on feet  Rationale for Evaluation and Treatment Rehabilitation  PERTINENT  HISTORY: none  PRECAUTIONS: Other: ACL protocol  SUBJECTIVE: doing good.   PAIN:  NPRS scale: 2/10 Pain location: Lt knee Pain description: throbbing Aggravating factors: bending, increased activity, lateral movement Relieving factors: avoiding provoking positions, medication   OBJECTIVE:    PATIENT SURVEYS:  10/14/21: FOTO 54 (predicted 77)   COGNITION: 10/14/21      Overall cognitive status: Within functional limits for tasks assessed                          SENSATION: 6/15/23WFL   EDEMA:  10/14/21:Circumferential: Lt knee joint line: 36 cm; Rt knee joint line: 35 cm   POSTURE: No Significant postural limitations   LOWER EXTREMITY ROM:   Active ROM Right eval Left eval Left 10/22/2021 Left 10/27/21  Knee flexion 147 76 90 in AROM supine heel slide 96 (Supine heel slide)  Knee extension 3 -19 (seated LAQ)     (Blank rows = not tested)     LOWER EXTREMITY ROM:   Passive ROM Right eval Left eval Left 10/27/21  Knee flexion   86 104  Knee extension   -2    (Blank rows = not tested)   LOWER EXTREMITY MMT:   10/14/21: Not formally tested due to post op status Thigh Girth (6" superior to patella): Lt 40 cm; Rt 43.5 cm   GAIT: 10/14/21:  Distance walked: 100' Level of assistance: Modified independence Comments: antalgic gait with decreased hip/knee flexion on Lt; amb with unlocked hinged knee brace  TODAY'S TREATMENT:  [facility] Adult PT Treatment:                                                DATE: 10/29/21 Therapeutic Exercise: Weightshifts at bar x 2 min Standing on L moving R: hamstring curl, SLR, hip abdct, hip ext, hip circles clockwise/counterclockwise x 20 each Weightshift at bar x 1 min Repeat of standing exercises with L LE performing Standing calf raise L x 20 at bar R sidelying L hip abdct x 20 with 2-3 sec hold R sidelying L hip abdct/ER combo x 20 L SLR x 20 L ARC SLR x 8, x 7 (working on hip abdct/addct with arc in middle) Quad set x 15  with concentration on form Pt declined ice   10/27/21 Therex: NuStep L6 x 8 min (UE/LE for ROM) Supine AA knee extension: quad set with strap x 20 reps (5 sec hold) Long sitting knee flexion AA LLLD holds up to 1-2 min Seated SLR x 10 reps - mod cues to decr extension lag      BFR (cuff size 3, LOP 170 mmHg, exercises at 127 mmHg): LAQ 30 reps; 15x3 reps with 3 sec breaks Sidelying Lt hip abduction 3x20 reps, 30 sec rest between sets  Modalities: Lt knee vaso x 10 min; mod pressure, 34 deg     10/22/2021 Therex: Nustep Lvl 6 9 mins UE/LE for Lt knee ROM to tolerance Supine heel slide AROM 5 sec hold /quad set 5 sec hold combo x 10 Lt leg  Supine SLR Lt x 5 (poor control, lacking 15-20 degrees in extension lag) Standing wall runner stretch for Lt leg posterior knee extension focus 15 sec x 5  Exercises c BFR set at 75% LOP (LOP 170 mmHg, exercise 127 mmHg)   Supine Lt quad set 5 sec on/off 5 mins    Seated LAQ to fatigue x, x 15, x 15, x 15 c 30 sec rest breaks after sets   Review of heel prop for extension mobility gains.   Neuro re-ed:  Church pew anterior/posterior weight shift ankle strategy 3 mins   Manual  Lt knee flexion PROM,contract/relax to quad for mobility gains to tolerance    Modalities: Vaso 34 degrees moderate compression x 10 min L knee elevated on flat bolster. Discussed that pt may have muscle soreness after treatment and to ice for 10-15 min as needed and to take a leisurely walk to help work out muscle soreness.   10/19/2021 Therex: Exercises c BFR set at 75% LOP (LOP 170 mmHg, exercise 127 mmHg)   Supine Lt quad set 5 sec hold 2 mins, 30 sec rest break, 2 mins   Seated LAQ to fatigue x22, x 15, x 15, x 15 c 30 sec rest breaks after sets  Supine quad set c SLR Lt x 6 (fatigue noted, mild anterior knee joint symptoms)  Reviewed heel slide with strap and sidelying hip abdct with PT verbal cues for body position and not performing hip hike with heel  slide and keeping body in alignment with hip abdct.    Modalities: Vaso 34 degrees moderate compression x 10 min L knee elevated on flat bolster. Discussed that pt may have muscle soreness after treatment and to ice for 10-15 min as needed and to take a leisurely walk to help work out muscle soreness.  10/14/21 See HEP - performed trial reps PRN for comprehension     PATIENT EDUCATION:  10/22/2021 Education details: HEP progression Person educated: Patient Education method: Explanation, Demonstration, and Handouts Education comprehension: verbalized understanding, returned demonstration, and needs further education     HOME EXERCISE PROGRAM: Access Code: [email or web address] URL: [email or web address] Date: 10/22/2021 Prepared by: Chyrel Masson  Exercises - Quad Setting and Stretching  - 8-10 x daily - 7 x weekly - 1 sets - 10 reps - 5 sec hold - Supine Heel Slide with Strap  - 8-10 x daily - 7 x weekly - 1 sets - 10 reps - Small Range Straight Leg Raise  - 2-3 x daily - 7 x weekly - 1-2 sets - 10 reps - Sidelying Hip Abduction  - 2-3 x daily - 7 x weekly - 1-2 sets - 10 reps - Supine Knee Extension Mobilization with Weight  - 3-5 x daily - 7 x weekly - 1 sets - 1 reps - 3-5 min hold - [employer] Pew  - 3-5 x daily - 7 x weekly - 1 sets - 1-2 mins hold - Gastroc Stretch on Wall  - 2-3 x daily - 7 x weekly - 1 sets - 3-5 reps - 15-30 hold   ASSESSMENT:   CLINICAL IMPRESSION: Pt tolerated today's treatment with fatigue due to heavy concentration on proper muscle facilitation prior to movement. Focused on strengthening this visit. No increased pain with tx.   OBJECTIVE IMPAIRMENTS decreased activity tolerance, decreased balance, decreased mobility, difficulty walking, decreased ROM, decreased strength, increased edema, increased fascial restrictions, increased muscle spasms, and pain.    ACTIVITY LIMITATIONS carrying, lifting, bending, standing, squatting, sleeping, stairs,  transfers, and locomotion level   PARTICIPATION LIMITATIONS: cleaning, laundry, driving, shopping, community activity, and sports   PERSONAL FACTORS Time since onset of injury/illness/exacerbation are also affecting patient's functional outcome.    REHAB POTENTIAL: Good   CLINICAL DECISION MAKING: Stable/uncomplicated   EVALUATION COMPLEXITY: Low       GOALS: Goals reviewed with patient? Yes   SHORT TERM GOALS: Target date: 11/11/2021   Independent with initial HEP Goal status: on going - assessed 10/22/2021   2.  Improve Lt knee AROM 0-100 for improved function Goal status: on going - assessed 10/22/2021   3.  Report pain < 4/10 with ROM activities and ambulation Goal status: on going - assessed 10/22/2021   LONG TERM GOALS: Target date: 12/09/2021   Independent with final HEP Goal status: INITIAL   2.  FOTO score improved to 77 Goal status: INITIAL   3.  Lt knee AROM improved to 0-140 for improved function and mobility Goal status: INIITAL   4.  Report pain < 2/10 with activity for improved function Goal status: INITIAL   5.  Lt thigh girth within 1cm of Rt thigh for improved LLE strength Goal status: INITIAL   6.  Amb without device or deviations for improved mobility Goal status: INITIAL       PLAN: PT FREQUENCY: 2x/week   PT DURATION: 8 weeks   PLANNED INTERVENTIONS: Therapeutic exercises, Therapeutic activity, Neuromuscular re-education, Balance training, Gait training, Patient/Family education, Joint mobilization, Stair training, Aquatic Therapy, Dry Needling, Electrical stimulation, Cryotherapy, Moist heat, Taping, Vasopneumatic device, Ultrasound, Manual therapy, and Re-evaluation   PLAN FOR NEXT SESSION:  continue per ACL protocol, progress mobility, improve quad activiation .  Combo with BFR as tolerated.  Static balance interventions.          Luna Fuse, PT 10/29/2021, 11:48 AM  [facility] [address] [place], [place], [address] Phone: [phone]   Fax:  [phone]

## 2021-11-01 ENCOUNTER — Telehealth: Payer: Self-pay | Admitting: Physical Therapy

## 2021-11-01 ENCOUNTER — Encounter: Payer: Self-pay | Admitting: Physical Therapy

## 2021-11-01 NOTE — Telephone Encounter (Signed)
LVM for pt due to missed PT appt.  Advised to call office with questions.  Clarita Crane, PT, DPT 11/01/21 10:37 AM

## 2021-11-03 ENCOUNTER — Ambulatory Visit (INDEPENDENT_AMBULATORY_CARE_PROVIDER_SITE_OTHER): Payer: Self-pay | Admitting: Physical Therapy

## 2021-11-03 ENCOUNTER — Encounter: Payer: Self-pay | Admitting: Physical Therapy

## 2021-11-03 DIAGNOSIS — M25662 Stiffness of left knee, not elsewhere classified: Secondary | ICD-10-CM

## 2021-11-03 DIAGNOSIS — R2689 Other abnormalities of gait and mobility: Secondary | ICD-10-CM

## 2021-11-03 DIAGNOSIS — M25562 Pain in left knee: Secondary | ICD-10-CM

## 2021-11-03 DIAGNOSIS — R600 Localized edema: Secondary | ICD-10-CM

## 2021-11-03 DIAGNOSIS — R2681 Unsteadiness on feet: Secondary | ICD-10-CM

## 2021-11-03 DIAGNOSIS — M6281 Muscle weakness (generalized): Secondary | ICD-10-CM

## 2021-11-03 NOTE — Therapy (Signed)
[facility] Physical Therapy [address] [place], [place], [address] Phone: [phone]   Fax:  [phone]  Patient Details  Name: Keith Frank MRN: [id number] Date of Birth: [birth year] Referring Provider:  No ref. provider found  Encounter Date: 11/03/2021   OUTPATIENT PHYSICAL THERAPY TREATMENT NOTE   Patient Name: Keith Frank MRN: [id number] DOB:[birth year], 31 y.o., male Today's Date: 11/03/2021  PCP: none REFERRING PROVIDER: Dr. Roda Shutters, MD  END OF SESSION:   PT End of Session - 11/03/21 1508     Visit Number 6    Number of Visits 16    Authorization Type Self Pay (applying for financial assistance)    PT Start Time 1507    PT Stop Time 1547    PT Time Calculation (min) 40 min    Activity Tolerance Patient tolerated treatment well;No increased pain    Behavior During Therapy [facility] for tasks assessed/performed                 History reviewed. No pertinent past medical history. Past Surgical History:  Procedure Laterality Date   KNEE ARTHROSCOPY WITH ANTERIOR CRUCIATE LIGAMENT (ACL) REPAIR Left 09/29/2021   Procedure: LEFT KNEE ANTERIOR CRUCIATE LIGAMENT RECONSTRUCTION, PARTIAL MEDIAL MENISCECTOMY;  Surgeon: Tarry Kos, MD;  Location: [facility];  Service: Orthopedics;  Laterality: Left;   Patient Active Problem List   Diagnosis Date Noted   Acute medial meniscus tear of left knee 08/26/2021   New ACL tear, left, initial encounter 07/13/2021    REFERRING DIAG: X10.626R (ICD-10-CM) - Acute medial meniscus tear of left knee, initial encounter S83.512A (ICD-10-CM) - New ACL tear, left, initial encounter   SURGERY DATE 09/29/21  THERAPY DIAG:  Acute pain of left knee  Stiffness of left knee, not elsewhere classified  Localized edema  Muscle weakness (generalized)  Other abnormalities of gait and mobility  Unsteadiness on feet  Rationale for Evaluation and Treatment Rehabilitation  PERTINENT HISTORY:  none  PRECAUTIONS: Other: ACL protocol  SUBJECTIVE: overslept and missed last appt   PAIN:  NPRS scale: 0 /10 Pain location: Lt knee Pain description: throbbing Aggravating factors: bending, increased activity, lateral movement Relieving factors: avoiding provoking positions, medication   OBJECTIVE:    PATIENT SURVEYS:  10/14/21: FOTO 54 (predicted 77)   EDEMA:  10/14/21:Circumferential: Lt knee joint line: 36 cm; Rt knee joint line: 35 cm   POSTURE: No Significant postural limitations   LOWER EXTREMITY ROM:   Active ROM Right eval Left eval Left 10/22/2021 Left 10/27/21 Lt 11/03/21  Knee flexion 147 76 90 in AROM supine heel slide 96 (Supine heel slide) AA: 105 A:101  Knee extension 3 -19 (seated LAQ)   -16 (seated LAQ)   (Blank rows = not tested)     LOWER EXTREMITY ROM:   Passive ROM Right eval Left eval Left 10/27/21  Knee flexion   86 104  Knee extension   -2    (Blank rows = not tested)   LOWER EXTREMITY MMT:   10/14/21: Not formally tested due to post op status Thigh Girth (6" superior to patella): Lt 40 cm; Rt 43.5 cm   GAIT: 10/14/21:  Distance walked: 100' Level of assistance: Modified independence Comments: antalgic gait with decreased hip/knee flexion on Lt; amb with unlocked hinged knee brace       TODAY'S TREATMENT: 11/03/21 Therex: NuStep L6 x 8 min  Long sitting knee flexion AA x 20 reps       BFR (cuff size 3, LOP 170  mmHg, exercises at 127 mmHg): LAQ 30 reps; 15x3 reps with 30 sec breaks Long sitting quad sets 30, 15x3 reps with 30 sec breaks   [facility] Adult PT Treatment:                                                DATE: 10/29/21 Therapeutic Exercise: Weightshifts at bar x 2 min Standing on L moving R: hamstring curl, SLR, hip abdct, hip ext, hip circles clockwise/counterclockwise x 20 each Weightshift at bar x 1 min Repeat of standing exercises with L LE performing Standing calf raise L x 20 at bar R sidelying L hip abdct x 20 with  2-3 sec hold R sidelying L hip abdct/ER combo x 20 L SLR x 20 L ARC SLR x 8, x 7 (working on hip abdct/addct with arc in middle) Quad set x 15 with concentration on form Pt declined ice   10/27/21 Therex: NuStep L6 x 8 min (UE/LE for ROM) Supine AA knee extension: quad set with strap x 20 reps (5 sec hold) Long sitting knee flexion AA LLLD holds up to 1-2 min Seated SLR x 10 reps - mod cues to decr extension lag      BFR (cuff size 3, LOP 170 mmHg, exercises at 127 mmHg): LAQ 30 reps; 15x3 reps with 3 sec breaks Sidelying Lt hip abduction 3x20 reps, 30 sec rest between sets  Modalities: Lt knee vaso x 10 min; mod pressure, 34 deg     10/22/2021 Therex: Nustep Lvl 6 9 mins UE/LE for Lt knee ROM to tolerance Supine heel slide AROM 5 sec hold /quad set 5 sec hold combo x 10 Lt leg  Supine SLR Lt x 5 (poor control, lacking 15-20 degrees in extension lag) Standing wall runner stretch for Lt leg posterior knee extension focus 15 sec x 5  Exercises c BFR set at 75% LOP (LOP 170 mmHg, exercise 127 mmHg)   Supine Lt quad set 5 sec on/off 5 mins    Seated LAQ to fatigue x, x 15, x 15, x 15 c 30 sec rest breaks after sets   Review of heel prop for extension mobility gains.   Neuro re-ed:  Church pew anterior/posterior weight shift ankle strategy 3 mins   Manual  Lt knee flexion PROM,contract/relax to quad for mobility gains to tolerance    Modalities: Vaso 34 degrees moderate compression x 10 min L knee elevated on flat bolster. Discussed that pt may have muscle soreness after treatment and to ice for 10-15 min as needed and to take a leisurely walk to help work out muscle soreness.     PATIENT EDUCATION:  10/22/2021 Education details: HEP progression Person educated: Patient Education method: Explanation, Demonstration, and Handouts Education comprehension: verbalized understanding, returned demonstration, and needs further education     HOME EXERCISE PROGRAM: Access Code:  [email or web address] URL: [email or web address] Date: 10/22/2021 Prepared by: Chyrel Masson  Exercises - Quad Setting and Stretching  - 8-10 x daily - 7 x weekly - 1 sets - 10 reps - 5 sec hold - Supine Heel Slide with Strap  - 8-10 x daily - 7 x weekly - 1 sets - 10 reps - Small Range Straight Leg Raise  - 2-3 x daily - 7 x weekly - 1-2 sets - 10 reps - Sidelying Hip Abduction  - 2-3 x daily - 7  x weekly - 1-2 sets - 10 reps - Supine Knee Extension Mobilization with Weight  - 3-5 x daily - 7 x weekly - 1 sets - 1 reps - 3-5 min hold - [employer] Pew  - 3-5 x daily - 7 x weekly - 1 sets - 1-2 mins hold - Gastroc Stretch on Wall  - 2-3 x daily - 7 x weekly - 1 sets - 3-5 reps - 15-30 hold   ASSESSMENT:   CLINICAL IMPRESSION: Pt with improvement in AROM noted today, still with difficulty getting good strong quad set at this time.  Will continue to benefit from PT to maximize function.  OBJECTIVE IMPAIRMENTS decreased activity tolerance, decreased balance, decreased mobility, difficulty walking, decreased ROM, decreased strength, increased edema, increased fascial restrictions, increased muscle spasms, and pain.    ACTIVITY LIMITATIONS carrying, lifting, bending, standing, squatting, sleeping, stairs, transfers, and locomotion level   PARTICIPATION LIMITATIONS: cleaning, laundry, driving, shopping, community activity, and sports   PERSONAL FACTORS Time since onset of injury/illness/exacerbation are also affecting patient's functional outcome.    REHAB POTENTIAL: Good   CLINICAL DECISION MAKING: Stable/uncomplicated   EVALUATION COMPLEXITY: Low       GOALS: Goals reviewed with patient? Yes   SHORT TERM GOALS: Target date: 11/11/2021   Independent with initial HEP Goal status: on going - assessed 10/22/2021   2.  Improve Lt knee AROM 0-100 for improved function Goal status: on going - assessed 10/22/2021   3.  Report pain < 4/10 with ROM activities and ambulation Goal status: on  going - assessed 10/22/2021   LONG TERM GOALS: Target date: 12/09/2021   Independent with final HEP Goal status: INITIAL   2.  FOTO score improved to 77 Goal status: INITIAL   3.  Lt knee AROM improved to 0-140 for improved function and mobility Goal status: INIITAL   4.  Report pain < 2/10 with activity for improved function Goal status: INITIAL   5.  Lt thigh girth within 1cm of Rt thigh for improved LLE strength Goal status: INITIAL   6.  Amb without device or deviations for improved mobility Goal status: INITIAL       PLAN: PT FREQUENCY: 2x/week   PT DURATION: 8 weeks   PLANNED INTERVENTIONS: Therapeutic exercises, Therapeutic activity, Neuromuscular re-education, Balance training, Gait training, Patient/Family education, Joint mobilization, Stair training, Aquatic Therapy, Dry Needling, Electrical stimulation, Cryotherapy, Moist heat, Taping, Vasopneumatic device, Ultrasound, Manual therapy, and Re-evaluation   PLAN FOR NEXT SESSION:   continue per ACL protocol, progress mobility, improve quad activiation .  Combo with BFR as tolerated.  Static balance interventions.   Look at gait in // bars.       Clarita Crane, PT, DPT 11/03/21 3:47 PM    [facility] Physical Therapy [address] [place], [place], [address] Phone: [phone]   Fax:  [phone]

## 2021-11-08 ENCOUNTER — Encounter: Payer: Self-pay | Admitting: Physical Therapy

## 2021-11-08 ENCOUNTER — Ambulatory Visit (INDEPENDENT_AMBULATORY_CARE_PROVIDER_SITE_OTHER): Payer: Self-pay | Admitting: Physical Therapy

## 2021-11-08 DIAGNOSIS — R2689 Other abnormalities of gait and mobility: Secondary | ICD-10-CM

## 2021-11-08 DIAGNOSIS — M6281 Muscle weakness (generalized): Secondary | ICD-10-CM

## 2021-11-08 DIAGNOSIS — M25562 Pain in left knee: Secondary | ICD-10-CM

## 2021-11-08 DIAGNOSIS — R2681 Unsteadiness on feet: Secondary | ICD-10-CM

## 2021-11-08 DIAGNOSIS — R600 Localized edema: Secondary | ICD-10-CM

## 2021-11-08 DIAGNOSIS — M25662 Stiffness of left knee, not elsewhere classified: Secondary | ICD-10-CM

## 2021-11-08 NOTE — Therapy (Signed)
[facility] [place], [place], [address] Phone: [phone]   Fax:  [phone]  Patient Details  Name: Keith Frank MRN: [id number] Date of Birth: 1990/08/14 Referring Provider:  Leandrew Koyanagi, MD  Encounter Date: 11/08/2021   OUTPATIENT PHYSICAL THERAPY TREATMENT NOTE   Patient Name: Keith Frank MRN: [id number] DOB:1990/10/11, 31 y.o., male Today's Date: 11/08/2021  PCP: none REFERRING PROVIDER: Dr. Erlinda Hong, MD  END OF SESSION:   PT End of Session - 11/08/21 1140     Visit Number 7    Number of Visits 16    Authorization Type Self Pay (applying for financial assistance)    PT Start Time 1133    PT Stop Time 1223    PT Time Calculation (min) 50 min    Activity Tolerance Patient tolerated treatment well;No increased pain    Behavior During Therapy [facility] for tasks assessed/performed                  History reviewed. No pertinent past medical history. Past Surgical History:  Procedure Laterality Date   KNEE ARTHROSCOPY WITH ANTERIOR CRUCIATE LIGAMENT (ACL) REPAIR Left 09/29/2021   Procedure: LEFT KNEE ANTERIOR CRUCIATE LIGAMENT RECONSTRUCTION, PARTIAL MEDIAL MENISCECTOMY;  Surgeon: Leandrew Koyanagi, MD;  Location: [place];  Service: Orthopedics;  Laterality: Left;   Patient Active Problem List   Diagnosis Date Noted   Acute medial meniscus tear of left knee 08/26/2021   New ACL tear, left, initial encounter 07/13/2021    REFERRING DIAG: O17.510C (ICD-10-CM) - Acute medial meniscus tear of left knee, initial encounter S83.512A (ICD-10-CM) - New ACL tear, left, initial encounter   SURGERY DATE 09/29/21  THERAPY DIAG:  Acute pain of left knee  Stiffness of left knee, not elsewhere classified  Localized edema  Muscle weakness (generalized)  Other abnormalities of gait and mobility  Unsteadiness on feet  Rationale for Evaluation and Treatment Rehabilitation  PERTINENT HISTORY:  none  PRECAUTIONS: Other: ACL protocol  SUBJECTIVE: doing well, did HEP over the weekend  PAIN:  NPRS scale: 0 /10 Pain location: Lt knee Pain description: throbbing Aggravating factors: bending, increased activity, lateral movement Relieving factors: avoiding provoking positions, medication   OBJECTIVE:    PATIENT SURVEYS:  10/14/21: FOTO 54 (predicted 77)   EDEMA:  10/14/21:Circumferential: Lt knee joint line: 36 cm; Rt knee joint line: 35 cm   POSTURE: No Significant postural limitations   LOWER EXTREMITY ROM:   Active ROM Right eval Left eval Left 10/22/2021 Left 10/27/21 Lt 11/03/21 Lt 11/08/21  Knee flexion 147 76 90 in AROM supine heel slide 96 (Supine heel slide) AA: 105 A:101 A: 101  Knee extension 3 -19 (seated LAQ)   -16 (seated LAQ) -4 (seated LAQ)   (Blank rows = not tested)     LOWER EXTREMITY ROM:   Passive ROM Right eval Left eval Left 10/27/21  Knee flexion   86 104  Knee extension   -2    (Blank rows = not tested)   LOWER EXTREMITY MMT:   10/14/21: Not formally tested due to post op status Thigh Girth (6" superior to patella): Lt 40 cm; Rt 43.5 cm   GAIT: 10/14/21:  Distance walked: 100' Level of assistance: Modified independence Comments: antalgic gait with decreased hip/knee flexion on Lt; amb with unlocked hinged knee brace       TODAY'S TREATMENT: 11/08/21 Therex: Bike seat 5 x 8 min; L2 Long sitting heel slides with AA x 20 reps In //  bars walking forward/backward for TKE and heel strike       BFR (cuff size 3, LOP 170 mmHg, exercises at 127 mmHg): LAQ 30 reps; 15x3 reps with 30 sec breaks Long sitting quad sets 30, 15x3 reps with 30 sec breaks - with strap for extension assist  11/03/21 Therex: NuStep L6 x 8 min  Long sitting knee flexion AA x 20 reps       BFR (cuff size 3, LOP 170 mmHg, exercises at 127 mmHg): LAQ 30 reps; 15x3 reps with 30 sec breaks Long sitting quad sets 30, 15x3 reps with 30 sec breaks    [facility] Adult  PT Treatment:                                                DATE: 10/29/21 Therapeutic Exercise: Weightshifts at bar x 2 min Standing on L moving R: hamstring curl, SLR, hip abdct, hip ext, hip circles clockwise/counterclockwise x 20 each Weightshift at bar x 1 min Repeat of standing exercises with L LE performing Standing calf raise L x 20 at bar R sidelying L hip abdct x 20 with 2-3 sec hold R sidelying L hip abdct/ER combo x 20 L SLR x 20 L ARC SLR x 8, x 7 (working on hip abdct/addct with arc in middle) Quad set x 15 with concentration on form Pt declined ice   PATIENT EDUCATION:  10/22/2021 Education details: HEP progression Person educated: Patient Education method: Consulting civil engineer, Demonstration, and Handouts Education comprehension: verbalized understanding, returned demonstration, and needs further education     HOME EXERCISE PROGRAM: Access Code: [email or web address] URL: [email or web address] Date: 10/22/2021 Prepared by: Scot Jun  Exercises - Quad Setting and Stretching  - 8-10 x daily - 7 x weekly - 1 sets - 10 reps - 5 sec hold - Supine Heel Slide with Strap  - 8-10 x daily - 7 x weekly - 1 sets - 10 reps - Small Range Straight Leg Raise  - 2-3 x daily - 7 x weekly - 1-2 sets - 10 reps - Sidelying Hip Abduction  - 2-3 x daily - 7 x weekly - 1-2 sets - 10 reps - Supine Knee Extension Mobilization with Weight  - 3-5 x daily - 7 x weekly - 1 sets - 1 reps - 3-5 min hold - [employer] P[employer]  - 3-5 x daily - 7 x weekly - 1 sets - 1-2 mins hold - Gastroc Stretch on Wall  - 2-3 x daily - 7 x weekly - 1 sets - 3-5 reps - 15-30 hold   ASSESSMENT:   CLINICAL IMPRESSION: Improvement noted in active knee extension today and improved quad activation noted as well.  Will continue to benefit from PT to maximize function.  OBJECTIVE IMPAIRMENTS decreased activity tolerance, decreased balance, decreased mobility, difficulty walking, decreased ROM, decreased strength, increased  edema, increased fascial restrictions, increased muscle spasms, and pain.    ACTIVITY LIMITATIONS carrying, lifting, bending, standing, squatting, sleeping, stairs, transfers, and locomotion level   PARTICIPATION LIMITATIONS: cleaning, laundry, driving, shopping, community activity, and sports   PERSONAL FACTORS Time since onset of injury/illness/exacerbation are also affecting patient's functional outcome.    REHAB POTENTIAL: Good   CLINICAL DECISION MAKING: Stable/uncomplicated   EVALUATION COMPLEXITY: Low       GOALS: Goals reviewed with patient? Yes   SHORT TERM GOALS: Target date: 11/11/2021  Independent with initial HEP Goal status: MET 11/08/21   2.  Improve Lt knee AROM 0-100 for improved function Goal status: ongoing  11/08/21   3.  Report pain < 4/10 with ROM activities and ambulation Goal status: MET 11/08/21   LONG TERM GOALS: Target date: 12/09/2021   Independent with final HEP Goal status: INITIAL   2.  FOTO score improved to 77 Goal status: INITIAL   3.  Lt knee AROM improved to 0-140 for improved function and mobility Goal status: INIITAL   4.  Report pain < 2/10 with activity for improved function Goal status: INITIAL   5.  Lt thigh girth within 1cm of Rt thigh for improved LLE strength Goal status: INITIAL   6.  Amb without device or deviations for improved mobility Goal status: INITIAL       PLAN: PT FREQUENCY: 2x/week   PT DURATION: 8 weeks   PLANNED INTERVENTIONS: Therapeutic exercises, Therapeutic activity, Neuromuscular re-education, Balance training, Gait training, Patient/Family education, Joint mobilization, Stair training, Aquatic Therapy, Dry Needling, Electrical stimulation, Cryotherapy, Moist heat, Taping, Vasopneumatic device, Ultrasound, Manual therapy, and Re-evaluation   PLAN FOR NEXT SESSION:   continue per ACL protocol, progress mobility, improve quad activiation .  Combo with BFR as tolerated.  Static balance interventions.          Laureen Abrahams, PT, DPT 11/08/21 12:25 PM   [facility] [address] [place], [place], [address] Phone: [phone]   Fax:  [phone]

## 2021-11-10 ENCOUNTER — Encounter: Payer: Self-pay | Admitting: Physical Therapy

## 2021-11-15 ENCOUNTER — Telehealth: Payer: Self-pay | Admitting: Orthopaedic Surgery

## 2021-11-15 ENCOUNTER — Ambulatory Visit (INDEPENDENT_AMBULATORY_CARE_PROVIDER_SITE_OTHER): Payer: Self-pay | Admitting: Rehabilitative and Restorative Service Providers"

## 2021-11-15 ENCOUNTER — Encounter: Payer: Self-pay | Admitting: Rehabilitative and Restorative Service Providers"

## 2021-11-15 DIAGNOSIS — M25662 Stiffness of left knee, not elsewhere classified: Secondary | ICD-10-CM

## 2021-11-15 DIAGNOSIS — M6281 Muscle weakness (generalized): Secondary | ICD-10-CM

## 2021-11-15 DIAGNOSIS — M25562 Pain in left knee: Secondary | ICD-10-CM

## 2021-11-15 DIAGNOSIS — R2681 Unsteadiness on feet: Secondary | ICD-10-CM

## 2021-11-15 DIAGNOSIS — R2689 Other abnormalities of gait and mobility: Secondary | ICD-10-CM

## 2021-11-15 DIAGNOSIS — R600 Localized edema: Secondary | ICD-10-CM

## 2021-11-15 NOTE — Telephone Encounter (Signed)
No standing more than 4 hrs/ a day.  No lifting more than 20 lbs.  No kneeling, crawling, climbing.

## 2021-11-15 NOTE — Telephone Encounter (Signed)
Patient states he just got a new job and they are requesting a letter with any/if restriction.     Patient call back number [phone]

## 2021-11-15 NOTE — Telephone Encounter (Signed)
Tried to call patient. No answer. Left message that work note is up front for pick up.

## 2021-11-15 NOTE — Therapy (Signed)
[facility] [address] [place], [place], [address] Phone: [phone]   Fax:  [phone]  Patient Details  Name: Keith Frank MRN: [id number] Date of Birth: 02/10/1991 Referring Provider:  No ref. provider found  Encounter Date: 11/15/2021   OUTPATIENT PHYSICAL THERAPY TREATMENT NOTE   Patient Name: Keith Frank MRN: [id number] DOB:09-Aug-1990, 31 y.o., male Today's Date: 11/15/2021  PCP: none REFERRING PROVIDER: Dr. Erlinda Hong, MD  END OF SESSION:   PT End of Session - 11/15/21 1413     Visit Number 8    Number of Visits 16    Authorization Type Self Pay (applying for financial assistance)    PT Start Time 1346    PT Stop Time 1428    PT Time Calculation (min) 42 min    Activity Tolerance Patient tolerated treatment well    Behavior During Therapy [facility] for tasks assessed/performed                   History reviewed. No pertinent past medical history. Past Surgical History:  Procedure Laterality Date   KNEE ARTHROSCOPY WITH ANTERIOR CRUCIATE LIGAMENT (ACL) REPAIR Left 09/29/2021   Procedure: LEFT KNEE ANTERIOR CRUCIATE LIGAMENT RECONSTRUCTION, PARTIAL MEDIAL MENISCECTOMY;  Surgeon: Leandrew Koyanagi, MD;  Location: [place];  Service: Orthopedics;  Laterality: Left;   Patient Active Problem List   Diagnosis Date Noted   Acute medial meniscus tear of left knee 08/26/2021   New ACL tear, left, initial encounter 07/13/2021    REFERRING DIAG: Y58.592T (ICD-10-CM) - Acute medial meniscus tear of left knee, initial encounter S83.512A (ICD-10-CM) - New ACL tear, left, initial encounter   SURGERY DATE 09/29/21  THERAPY DIAG:  Acute pain of left knee  Stiffness of left knee, not elsewhere classified  Localized edema  Muscle weakness (generalized)  Other abnormalities of gait and mobility  Unsteadiness on feet  Rationale for Evaluation and Treatment Rehabilitation  PERTINENT HISTORY: none  PRECAUTIONS:  Other: ACL protocol  SUBJECTIVE: Pt indicated no pain upon arrival.  Pt indicated getting job and will be starting it.  Reported having to move and wasn't sure about how to attend the frequency of visits.   PAIN:  NPRS scale: 0 /10 Pain location: Lt knee Pain description: throbbing Aggravating factors: bending, increased activity, lateral movement Relieving factors: avoiding provoking positions, medication   OBJECTIVE:    PATIENT SURVEYS:  10/14/21: FOTO 54 (predicted 77)   EDEMA:  10/14/21:Circumferential: Lt knee joint line: 36 cm; Rt knee joint line: 35 cm   POSTURE: No Significant postural limitations   LOWER EXTREMITY ROM:   Active ROM Right eval Left eval Left 10/22/2021 Left 10/27/21 Lt 11/03/21 Lt 11/08/21 Left 11/15/2021  Knee flexion 147 76 90 in AROM supine heel slide 96 (Supine heel slide) AA: 105 A:101 A: 101 AROM in supine heel slide:  114   Knee extension 3 -19 (seated LAQ)   -16 (seated LAQ) -4 (seated LAQ)    (Blank rows = not tested)     LOWER EXTREMITY ROM:   Passive ROM Right eval Left eval Left 10/27/21  Knee flexion   86 104  Knee extension   -2    (Blank rows = not tested)   LOWER EXTREMITY MMT:   10/14/21: Not formally tested due to post op status Thigh Girth (6" superior to patella): Lt 40 cm; Rt 43.5 cm   GAIT: 11/15/2021: independent ambulation within clinic.  Wearing locking brace outside of clinic.   10/14/21:  Distance walked: 100' Level of assistance: Modified independence Comments: antalgic gait with decreased hip/knee flexion on Lt; amb with unlocked hinged knee brace       TODAY'S TREATMENT: 11/15/21 Therex: Bike seat 5 x 10 min; L2 Incline board runner stretch Lt leg posterior 30 sec x 3  Lt leg press machine 25 lbs 2 x 15 (0-90 degrees) - BFR in future possible Supine Lt AROM heel slide x 10         BFR (cuff size 3, LOP 170 mmHg, exercises at 127 mmHg): Seated SLR Lt 3 x 15   Review of HEP for at home use (frequency and  consistency)  Neuro Re-ed  Retro step Lt leg posterior c stepping over 4 inch block for knee flexion mobility - 20x  Feet together on foam [employer] pew anterior/posterior weight shifting 2 mins  SLS c contralateral leg cone touching (4 cones anterior/medial to anterior to anterior/lateral) x 10 bilateral  11/08/21 Therex: Bike seat 5 x 8 min; L2 Long sitting heel slides with AA x 20 reps In // bars walking forward/backward for TKE and heel strike       BFR (cuff size 3, LOP 170 mmHg, exercises at 127 mmHg): LAQ 30 reps; 15x3 reps with 30 sec breaks Long sitting quad sets 30, 15x3 reps with 30 sec breaks - with strap for extension assist  11/03/21 Therex: NuStep L6 x 8 min  Long sitting knee flexion AA x 20 reps       BFR (cuff size 3, LOP 170 mmHg, exercises at 127 mmHg): LAQ 30 reps; 15x3 reps with 30 sec breaks Long sitting quad sets 30, 15x3 reps with 30 sec breaks    PATIENT EDUCATION:  10/22/2021 Education details: HEP progression Person educated: Patient Education method: Consulting civil engineer, Demonstration, and Handouts Education comprehension: verbalized understanding, returned demonstration, and needs further education     HOME EXERCISE PROGRAM: Access Code: [email or web address] URL: [email or web address] Date: 10/22/2021 Prepared by: Scot Jun  Exercises - Quad Setting and Stretching  - 8-10 x daily - 7 x weekly - 1 sets - 10 reps - 5 sec hold - Supine Heel Slide with Strap  - 8-10 x daily - 7 x weekly - 1 sets - 10 reps - Small Range Straight Leg Raise  - 2-3 x daily - 7 x weekly - 1-2 sets - 10 reps - Sidelying Hip Abduction  - 2-3 x daily - 7 x weekly - 1-2 sets - 10 reps - Supine Knee Extension Mobilization with Weight  - 3-5 x daily - 7 x weekly - 1 sets - 1 reps - 3-5 min hold - [employer]  - 3-5 x daily - 7 x weekly - 1 sets - 1-2 mins hold - Gastroc Stretch on Wall  - 2-3 x daily - 7 x weekly - 1 sets - 3-5 reps - 15-30 hold   ASSESSMENT:   CLINICAL  IMPRESSION: Pt has demonstrated improvement in active movement and quad activation to this point.  Incorporation of static balance interventions for neuro muscular control within clinic today as well as early light resistance leg press movement to 90 degrees without complaints.  Continued skilled PT services indicated to continue progression towards goals.   OBJECTIVE IMPAIRMENTS decreased activity tolerance, decreased balance, decreased mobility, difficulty walking, decreased ROM, decreased strength, increased edema, increased fascial restrictions, increased muscle spasms, and pain.    ACTIVITY LIMITATIONS carrying, lifting, bending, standing, squatting, sleeping, stairs, transfers, and locomotion level   PARTICIPATION LIMITATIONS: cleaning, laundry,  driving, shopping, community activity, and sports   PERSONAL FACTORS Time since onset of injury/illness/exacerbation are also affecting patient's functional outcome.    REHAB POTENTIAL: Good   CLINICAL DECISION MAKING: Stable/uncomplicated   EVALUATION COMPLEXITY: Low       GOALS: Goals reviewed with patient? Yes   SHORT TERM GOALS: Target date: 11/11/2021   Independent with initial HEP Goal status: MET 11/08/21   2.  Improve Lt knee AROM 0-100 for improved function Goal status: Met 11/15/2021   3.  Report pain < 4/10 with ROM activities and ambulation Goal status: MET 11/08/21   LONG TERM GOALS: Target date: 12/09/2021   Independent with final HEP Goal status: on going - assessed 11/15/2021   2.  FOTO score improved to 77 Goal status: on going - assessed 11/15/2021   3.  Lt knee AROM improved to 0-140 for improved function and mobility Goal status: on going - assessed 11/15/2021   4.  Report pain < 2/10 with activity for improved function Goal status: on going - assessed 11/15/2021   5.  Lt thigh girth within 1cm of Rt thigh for improved LLE strength Goal status: on going - assessed 11/15/2021   6.  Amb without device or  deviations for improved mobility Goal status: on going - assessed 11/15/2021       PLAN: PT FREQUENCY: 2x/week   PT DURATION: 8 weeks   PLANNED INTERVENTIONS: Therapeutic exercises, Therapeutic activity, Neuromuscular re-education, Balance training, Gait training, Patient/Family education, Joint mobilization, Stair training, Aquatic Therapy, Dry Needling, Electrical stimulation, Cryotherapy, Moist heat, Taping, Vasopneumatic device, Ultrasound, Manual therapy, and Re-evaluation   PLAN FOR NEXT SESSION:   Leg press, static balance, progressive quad strengthening, usual ACL surgery precautions.   Scot Jun, PT, DPT, OCS, ATC 11/15/21  2:30 PM       [facility] [address] [place], [place], [address] Phone: [phone]   Fax:  [phone]

## 2021-11-17 ENCOUNTER — Encounter: Payer: Self-pay | Admitting: Physical Therapy

## 2021-11-17 ENCOUNTER — Ambulatory Visit (INDEPENDENT_AMBULATORY_CARE_PROVIDER_SITE_OTHER): Payer: Self-pay | Admitting: Physical Therapy

## 2021-11-17 DIAGNOSIS — R600 Localized edema: Secondary | ICD-10-CM

## 2021-11-17 DIAGNOSIS — R2681 Unsteadiness on feet: Secondary | ICD-10-CM

## 2021-11-17 DIAGNOSIS — R2689 Other abnormalities of gait and mobility: Secondary | ICD-10-CM

## 2021-11-17 DIAGNOSIS — M6281 Muscle weakness (generalized): Secondary | ICD-10-CM

## 2021-11-17 DIAGNOSIS — M25662 Stiffness of left knee, not elsewhere classified: Secondary | ICD-10-CM

## 2021-11-17 DIAGNOSIS — M25562 Pain in left knee: Secondary | ICD-10-CM

## 2021-11-17 NOTE — Therapy (Signed)
[facility] [address] [place], [place], [address] Phone: [phone]   Fax:  [phone]  Patient Details  Name: Keith Frank MRN: [id number] Date of Birth: 02/05/1991 Referring Provider:  No ref. provider found  Encounter Date: 11/17/2021   OUTPATIENT PHYSICAL THERAPY TREATMENT NOTE   Patient Name: Keith Frank MRN: [id number] DOB:1990-05-20, 31 y.o., male Today's Date: 11/17/2021  PCP: none REFERRING PROVIDER: Dr. Erlinda Hong, MD  END OF SESSION:   PT End of Session - 11/17/21 1342     Visit Number 9    Number of Visits 16    Date for PT Re-Evaluation 12/09/21    Authorization Type CAFA 07/13/21-01/13/22    PT Start Time 1345    PT Stop Time 1424    PT Time Calculation (min) 39 min    Activity Tolerance Patient tolerated treatment well    Behavior During Therapy [facility] for tasks assessed/performed                    History reviewed. No pertinent past medical history. Past Surgical History:  Procedure Laterality Date   KNEE ARTHROSCOPY WITH ANTERIOR CRUCIATE LIGAMENT (ACL) REPAIR Left 09/29/2021   Procedure: LEFT KNEE ANTERIOR CRUCIATE LIGAMENT RECONSTRUCTION, PARTIAL MEDIAL MENISCECTOMY;  Surgeon: Leandrew Koyanagi, MD;  Location: [place];  Service: Orthopedics;  Laterality: Left;   Patient Active Problem List   Diagnosis Date Noted   Acute medial meniscus tear of left knee 08/26/2021   New ACL tear, left, initial encounter 07/13/2021    REFERRING DIAG: P37.902I (ICD-10-CM) - Acute medial meniscus tear of left knee, initial encounter S83.512A (ICD-10-CM) - New ACL tear, left, initial encounter   SURGERY DATE 09/29/21  THERAPY DIAG:  Acute pain of left knee  Stiffness of left knee, not elsewhere classified  Localized edema  Muscle weakness (generalized)  Other abnormalities of gait and mobility  Unsteadiness on feet  Rationale for Evaluation and Treatment Rehabilitation  PERTINENT HISTORY:  none  PRECAUTIONS: Other: ACL protocol  SUBJECTIVE: doing well, feels knee is getting better; starting a new job next week  PAIN:  NPRS scale: 0 /10 Pain location: Lt knee Pain description: throbbing Aggravating factors: bending, increased activity, lateral movement Relieving factors: avoiding provoking positions, medication   OBJECTIVE:    PATIENT SURVEYS:  10/14/21: FOTO 54 (predicted 77) 11/17/21: FOTO 65   EDEMA:  10/14/21:Circumferential: Lt knee joint line: 36 cm; Rt knee joint line: 35 cm   POSTURE: No Significant postural limitations   LOWER EXTREMITY ROM:   Active ROM Right eval Left eval Left 10/22/2021 Left 10/27/21 Lt 11/03/21 Lt 11/08/21 Left 11/15/2021  Knee flexion 147 76 90 in AROM supine heel slide 96 (Supine heel slide) AA: 105 A:101 A: 101 AROM in supine heel slide:  114   Knee extension 3 -19 (seated LAQ)   -16 (seated LAQ) -4 (seated LAQ)    (Blank rows = not tested)     LOWER EXTREMITY ROM:   Passive ROM Right eval Left eval Left 10/27/21  Knee flexion   86 104  Knee extension   -2    (Blank rows = not tested)   LOWER EXTREMITY MMT:   10/14/21: Not formally tested due to post op status Thigh Girth (6" superior to patella): Lt 40 cm; Rt 43.5 cm   GAIT: 11/15/2021: independent ambulation within clinic.  Wearing locking brace outside of clinic.   10/14/21:  Distance walked: 100' Level of assistance: Modified independence Comments: antalgic gait with  decreased hip/knee flexion on Lt; amb with unlocked hinged knee brace       TODAY'S TREATMENT: 11/17/21 Therex:  BFR (cuff size 3, LOP 170 mmHg, exercises at 127 mmHg) Bike seat 5 x 5 min; L3 LLE only leg press 62# x 30 reps, then 50# 3x10 LLE LAQ 3# 30 reps, 3x15 reps (8# last set)    Without BFR LLE single leg deadlift with 10# KB 2x15 - min cues for technique TRX squats with Rt heel raise to increase Lt weight bearing 2x10 Supine Lt hamstring stretch 3x30 sec Prone Lt quad stretch 3x30  sec  11/15/21 Therex: Bike seat 5 x 10 min; L2 Incline board runner stretch Lt leg posterior 30 sec x 3  Lt leg press machine 25 lbs 2 x 15 (0-90 degrees) - BFR in future possible Supine Lt AROM heel slide x 10         BFR (cuff size 3, LOP 170 mmHg, exercises at 127 mmHg): Seated SLR Lt 3 x 15   Review of HEP for at home use (frequency and consistency)  Neuro Re-ed  Retro step Lt leg posterior c stepping over 4 inch block for knee flexion mobility - 20x  Feet together on foam [employer]w anterior/posterior weight shifting 2 mins  SLS c contralateral leg cone touching (4 cones anterior/medial to anterior to anterior/lateral) x 10 bilateral  11/08/21 Therex: Bike seat 5 x 8 min; L2 Long sitting heel slides with AA x 20 reps In // bars walking forward/backward for TKE and heel strike       BFR (cuff size 3, LOP 170 mmHg, exercises at 127 mmHg): LAQ 30 reps; 15x3 reps with 30 sec breaks Long sitting quad sets 30, 15x3 reps with 30 sec breaks - with strap for extension assist  11/03/21 Therex: NuStep L6 x 8 min  Long sitting knee flexion AA x 20 reps       BFR (cuff size 3, LOP 170 mmHg, exercises at 127 mmHg): LAQ 30 reps; 15x3 reps with 30 sec breaks Long sitting quad sets 30, 15x3 reps with 30 sec breaks    PATIENT EDUCATION:  10/22/2021 Education details: HEP progression Person educated: Patient Education method: Consulting civil engineer, Demonstration, and Handouts Education comprehension: verbalized understanding, returned demonstration, and needs further education     HOME EXERCISE PROGRAM: Access Code: [email or web address] URL: [email or web address] Date: 10/22/2021 Prepared by: Scot Jun  Exercises - Quad Setting and Stretching  - 8-10 x daily - 7 x weekly - 1 sets - 10 reps - 5 sec hold - Supine Heel Slide with Strap  - 8-10 x daily - 7 x weekly - 1 sets - 10 reps - Small Range Straight Leg Raise  - 2-3 x daily - 7 x weekly - 1-2 sets - 10 reps - Sidelying Hip  Abduction  - 2-3 x daily - 7 x weekly - 1-2 sets - 10 reps - Supine Knee Extension Mobilization with Weight  - 3-5 x daily - 7 x weekly - 1 sets - 1 reps - 3-5 min hold - [employer]  - 3-5 x daily - 7 x weekly - 1 sets - 1-2 mins hold - Gastroc Stretch on Wall  - 2-3 x daily - 7 x weekly - 1 sets - 3-5 reps - 15-30 hold   ASSESSMENT:   CLINICAL IMPRESSION: Increased strengthening exercises today with good tolerance, but expected fatigue noted.  Overall will continue to benefit from PT to maximize function.  FOTO score improved today.  OBJECTIVE IMPAIRMENTS decreased activity tolerance, decreased balance, decreased mobility, difficulty walking, decreased ROM, decreased strength, increased edema, increased fascial restrictions, increased muscle spasms, and pain.    ACTIVITY LIMITATIONS carrying, lifting, bending, standing, squatting, sleeping, stairs, transfers, and locomotion level   PARTICIPATION LIMITATIONS: cleaning, laundry, driving, shopping, community activity, and sports   PERSONAL FACTORS Time since onset of injury/illness/exacerbation are also affecting patient's functional outcome.    REHAB POTENTIAL: Good   CLINICAL DECISION MAKING: Stable/uncomplicated   EVALUATION COMPLEXITY: Low       GOALS: Goals reviewed with patient? Yes   SHORT TERM GOALS: Target date: 11/11/2021   Independent with initial HEP Goal status: MET 11/08/21   2.  Improve Lt knee AROM 0-100 for improved function Goal status: Met 11/15/2021   3.  Report pain < 4/10 with ROM activities and ambulation Goal status: MET 11/08/21   LONG TERM GOALS: Target date: 12/09/2021   Independent with final HEP Goal status: on going - assessed 11/15/2021   2.  FOTO score improved to 77 Goal status: on going - assessed 11/15/2021   3.  Lt knee AROM improved to 0-140 for improved function and mobility Goal status: on going - assessed 11/15/2021   4.  Report pain < 2/10 with activity for improved function Goal  status: on going - assessed 11/15/2021   5.  Lt thigh girth within 1cm of Rt thigh for improved LLE strength Goal status: on going - assessed 11/15/2021   6.  Amb without device or deviations for improved mobility Goal status: on going - assessed 11/15/2021       PLAN: PT FREQUENCY: 2x/week   PT DURATION: 8 weeks   PLANNED INTERVENTIONS: Therapeutic exercises, Therapeutic activity, Neuromuscular re-education, Balance training, Gait training, Patient/Family education, Joint mobilization, Stair training, Aquatic Therapy, Dry Needling, Electrical stimulation, Cryotherapy, Moist heat, Taping, Vasopneumatic device, Ultrasound, Manual therapy, and Re-evaluation   PLAN FOR NEXT SESSION:   Leg press, static balance, progressive quad strengthening, usual ACL surgery precautions.   Laureen Abrahams, PT, DPT 11/17/21 2:24 PM   [facility] [address] [place], [place], [address] Phone: [phone]   Fax:  [phone]

## 2021-11-22 ENCOUNTER — Ambulatory Visit (INDEPENDENT_AMBULATORY_CARE_PROVIDER_SITE_OTHER): Payer: Self-pay | Admitting: Rehabilitative and Restorative Service Providers"

## 2021-11-22 ENCOUNTER — Encounter: Payer: Self-pay | Admitting: Rehabilitative and Restorative Service Providers"

## 2021-11-22 DIAGNOSIS — R600 Localized edema: Secondary | ICD-10-CM

## 2021-11-22 DIAGNOSIS — M6281 Muscle weakness (generalized): Secondary | ICD-10-CM

## 2021-11-22 DIAGNOSIS — M25562 Pain in left knee: Secondary | ICD-10-CM

## 2021-11-22 DIAGNOSIS — R2681 Unsteadiness on feet: Secondary | ICD-10-CM

## 2021-11-22 DIAGNOSIS — M25662 Stiffness of left knee, not elsewhere classified: Secondary | ICD-10-CM

## 2021-11-22 DIAGNOSIS — R2689 Other abnormalities of gait and mobility: Secondary | ICD-10-CM

## 2021-11-22 NOTE — Therapy (Signed)
[facility] Therapy [address] [place], [place], [address] Phone: [phone]   Fax:  [phone]  Patient Details  Name: Keith Frank MRN: [id number] Date of Birth: April 18, 1991 Referring Provider:  No ref. provider found  Encounter Date: 11/22/2021   OUTPATIENT PHYSICAL THERAPY TREATMENT NOTE   Patient Name: Keith Frank MRN: [id number] DOB:05-12-1990, 31 y.o., male Today's Date: 11/22/2021  PCP: none REFERRING PROVIDER: Dr. Erlinda Hong, MD  END OF SESSION:   PT End of Session - 11/22/21 1353     Visit Number 10    Number of Visits 16    Date for PT Re-Evaluation 12/09/21    Authorization Type CAFA 07/13/21-01/13/22    PT Start Time 1343    PT Stop Time 1424    PT Time Calculation (min) 41 min    Activity Tolerance Patient tolerated treatment well    Behavior During Therapy [facility] for tasks assessed/performed                     History reviewed. No pertinent past medical history. Past Surgical History:  Procedure Laterality Date   KNEE ARTHROSCOPY WITH ANTERIOR CRUCIATE LIGAMENT (ACL) REPAIR Left 09/29/2021   Procedure: LEFT KNEE ANTERIOR CRUCIATE LIGAMENT RECONSTRUCTION, PARTIAL MEDIAL MENISCECTOMY;  Surgeon: Leandrew Koyanagi, MD;  Location: [place];  Service: Orthopedics;  Laterality: Left;   Patient Active Problem List   Diagnosis Date Noted   Acute medial meniscus tear of left knee 08/26/2021   New ACL tear, left, initial encounter 07/13/2021    REFERRING DIAG: G88.110R (ICD-10-CM) - Acute medial meniscus tear of left knee, initial encounter S83.512A (ICD-10-CM) - New ACL tear, left, initial encounter   SURGERY DATE 09/29/21  THERAPY DIAG:  Acute pain of left knee  Stiffness of left knee, not elsewhere classified  Localized edema  Muscle weakness (generalized)  Other abnormalities of gait and mobility  Unsteadiness on feet  Rationale for Evaluation and Treatment Rehabilitation  PERTINENT HISTORY:  none  PRECAUTIONS: Other: ACL protocol  SUBJECTIVE:   Pt indicated feeling pop in Lt knee when helping lift 4 wheeler into a van.  Reported sore that day but not painful afterward.   PAIN:  NPRS scale: 0 /10 no pain upon arrival.  Pain location: Lt knee Pain description: throbbing Aggravating factors: bending, increased activity, lateral movement Relieving factors: avoiding provoking positions, medication   OBJECTIVE:    PATIENT SURVEYS:  11/17/21: FOTO 65 10/14/21: FOTO 54 (predicted 77)    EDEMA:  10/14/21:Circumferential: Lt knee joint line: 36 cm; Rt knee joint line: 35 cm   POSTURE: No Significant postural limitations   LOWER EXTREMITY ROM:   Active ROM Right eval Left eval Left 10/22/2021 Left 10/27/21 Lt 11/03/21 Lt 11/08/21 Left 11/15/2021  Knee flexion 147 76 90 in AROM supine heel slide 96 (Supine heel slide) AA: 105 A:101 A: 101 AROM in supine heel slide:  114   Knee extension 3 -19 (seated LAQ)   -16 (seated LAQ) -4 (seated LAQ)    (Blank rows = not tested)     LOWER EXTREMITY ROM:   Passive ROM Right eval Left eval Left 10/27/21  Knee flexion   86 104  Knee extension   -2    (Blank rows = not tested)   LOWER EXTREMITY MMT: 11/22/2021:  Lower Extremity Right 11/22/2021 Left 11/22/2021   MMT MMT  Hip Flexion    Hip Extension    Hip Abduction    Hip Adduction  Hip Internal rotation    Hip External rotation    Knee Flexion  5/5  Knee Extension  4/5  Ankle Dorsiflexion    Ankle Plantarflexion    Ankle Inversion    Ankle Eversion     (Blank rows = not tested)  * pain   10/14/21: Not formally tested due to post op status Thigh Girth (6" superior to patella): Lt 40 cm; Rt 43.5 cm   GAIT: 11/15/2021: independent ambulation within clinic.  Wearing locking brace outside of clinic.   10/14/21:  Distance walked: 100' Level of assistance: Modified independence Comments: antalgic gait with decreased hip/knee flexion on Lt; amb with unlocked hinged  knee brace       TODAY'S TREATMENT: 11/22/21 Therex:  BFR (cuff size 3, LOP 170 mmHg, exercises at 127 mmHg except bike) Bike seat 5, 5 mins  (lvl 3), 30 sec rest, 5 mins (lvl 4) c BFR 120 mmHg - goal of 60 rpm or higher LLE only leg press 62# x 30 reps, 3 x 15 c 30 sec rest breaks between each set    Without BFR LLE single leg deadlift with 10# KB 2x 10 Lt leg lateral step up/down 4 inch c TKE blue band hold at top on step 2 x 10  Neuro Re-ed [employer] fwd/back light touch control x 30 SLS on foam 30 sec x 4 bilateral  11/17/21 Therex:  BFR (cuff size 3, LOP 170 mmHg, exercises at 127 mmHg) Bike seat 5 x 5 min; L3 LLE only leg press 62# x 30 reps, then 50# 3x10 LLE LAQ 3# 30 reps, 3x15 reps (8# last set)    Without BFR LLE single leg deadlift with 10# KB 2x15 - min cues for technique TRX squats with Rt heel raise to increase Lt weight bearing 2x10 Supine Lt hamstring stretch 3x30 sec Prone Lt quad stretch 3x30 sec  11/15/21 Therex: Bike seat 5 x 10 min; L2 Incline board runner stretch Lt leg posterior 30 sec x 3  Lt leg press machine 25 lbs 2 x 15 (0-90 degrees) - BFR in future possible Supine Lt AROM heel slide x 10         BFR (cuff size 3, LOP 170 mmHg, exercises at 127 mmHg): Seated SLR Lt 3 x 15   Review of HEP for at home use (frequency and consistency)  Neuro Re-ed  Retro step Lt leg posterior c stepping over 4 inch block for knee flexion mobility - 20x  Feet together on foam [employer] anterior/posterior weight shifting 2 mins  SLS c contralateral leg cone touching (4 cones anterior/medial to anterior to anterior/lateral) x 10 bilateral  PATIENT EDUCATION:  10/22/2021 Education details: HEP progression Person educated: Patient Education method: Consulting civil engineer, Media planner, and Handouts Education comprehension: verbalized understanding, returned demonstration, and needs further education     HOME EXERCISE PROGRAM: Access Code: [email or web address] URL:  [email or web address] Date: 10/22/2021 Prepared by: Scot Jun  Exercises - Quad Setting and Stretching  - 8-10 x daily - 7 x weekly - 1 sets - 10 reps - 5 sec hold - Supine Heel Slide with Strap  - 8-10 x daily - 7 x weekly - 1 sets - 10 reps - Small Range Straight Leg Raise  - 2-3 x daily - 7 x weekly - 1-2 sets - 10 reps - Sidelying Hip Abduction  - 2-3 x daily - 7 x weekly - 1-2 sets - 10 reps - Supine Knee Extension Mobilization with Weight  - 3-5 x  daily - 7 x weekly - 1 sets - 1 reps - 3-5 min hold - [employer]  - 3-5 x daily - 7 x weekly - 1 sets - 1-2 mins hold - Gastroc Stretch on Wall  - 2-3 x daily - 7 x weekly - 1 sets - 3-5 reps - 15-30 hold   ASSESSMENT:   CLINICAL IMPRESSION: Continued focus on improve strength and movement coordination warranted from skilled PT services at this time due to muscle weakness and balance control deficits.   OBJECTIVE IMPAIRMENTS decreased activity tolerance, decreased balance, decreased mobility, difficulty walking, decreased ROM, decreased strength, increased edema, increased fascial restrictions, increased muscle spasms, and pain.    ACTIVITY LIMITATIONS carrying, lifting, bending, standing, squatting, sleeping, stairs, transfers, and locomotion level   PARTICIPATION LIMITATIONS: cleaning, laundry, driving, shopping, community activity, and sports   PERSONAL FACTORS Time since onset of injury/illness/exacerbation are also affecting patient's functional outcome.    REHAB POTENTIAL: Good   CLINICAL DECISION MAKING: Stable/uncomplicated   EVALUATION COMPLEXITY: Low       GOALS: Goals reviewed with patient? Yes   SHORT TERM GOALS: Target date: 11/11/2021   Independent with initial HEP Goal status: MET 11/08/21   2.  Improve Lt knee AROM 0-100 for improved function Goal status: Met 11/15/2021   3.  Report pain < 4/10 with ROM activities and ambulation Goal status: MET 11/08/21   LONG TERM GOALS: Target date:  12/09/2021   Independent with final HEP Goal status: on going - assessed 11/15/2021   2.  FOTO score improved to 77 Goal status: on going - assessed 11/15/2021   3.  Lt knee AROM improved to 0-140 for improved function and mobility Goal status: on going - assessed 11/15/2021   4.  Report pain < 2/10 with activity for improved function Goal status: on going - assessed 11/15/2021   5.  Lt thigh girth within 1cm of Rt thigh for improved LLE strength Goal status: on going - assessed 11/15/2021   6.  Amb without device or deviations for improved mobility Goal status: on going - assessed 11/15/2021       PLAN: PT FREQUENCY: 2x/week   PT DURATION: 8 weeks   PLANNED INTERVENTIONS: Therapeutic exercises, Therapeutic activity, Neuromuscular re-education, Balance training, Gait training, Patient/Family education, Joint mobilization, Stair training, Aquatic Therapy, Dry Needling, Electrical stimulation, Cryotherapy, Moist heat, Taping, Vasopneumatic device, Ultrasound, Manual therapy, and Re-evaluation   PLAN FOR NEXT SESSION:   Continued progressive strengthening, early compliant surface balance progression.   Dynamometry testing    Scot Jun, PT, DPT, OCS, ATC 11/22/21  2:25 PM     [facility] [address] [place], [place], [address] Phone: [phone]   Fax:  [phone]

## 2021-11-25 ENCOUNTER — Encounter: Payer: Self-pay | Admitting: Physical Therapy

## 2021-11-29 ENCOUNTER — Encounter: Payer: Self-pay | Admitting: Physical Therapy

## 2021-11-29 ENCOUNTER — Ambulatory Visit (INDEPENDENT_AMBULATORY_CARE_PROVIDER_SITE_OTHER): Payer: Self-pay | Admitting: Physical Therapy

## 2021-11-29 DIAGNOSIS — R2681 Unsteadiness on feet: Secondary | ICD-10-CM

## 2021-11-29 DIAGNOSIS — R600 Localized edema: Secondary | ICD-10-CM

## 2021-11-29 DIAGNOSIS — M25562 Pain in left knee: Secondary | ICD-10-CM

## 2021-11-29 DIAGNOSIS — R2689 Other abnormalities of gait and mobility: Secondary | ICD-10-CM

## 2021-11-29 DIAGNOSIS — M6281 Muscle weakness (generalized): Secondary | ICD-10-CM

## 2021-11-29 DIAGNOSIS — M25662 Stiffness of left knee, not elsewhere classified: Secondary | ICD-10-CM

## 2021-11-29 NOTE — Therapy (Signed)
[facility] Physical Therapy [address] [place], [place], [address] Phone: [phone]   Fax:  [phone]  Patient Details  Name: Keith Frank MRN: [id number] Date of Birth: 1991-01-01 Referring Provider:  No ref. provider found  Encounter Date: 11/29/2021   OUTPATIENT PHYSICAL THERAPY TREATMENT NOTE   Patient Name: Keith Frank MRN: [id number] DOB:08/08/1990, 31 y.o., male Today's Date: 11/29/2021  PCP: none REFERRING PROVIDER: Dr. Erlinda Hong, MD  END OF SESSION:   PT End of Session - 11/29/21 1334     Visit Number 11    Number of Visits 16    Date for PT Re-Evaluation 12/09/21    Authorization Type CAFA 07/13/21-01/13/22    PT Start Time 1330    PT Stop Time 1410    PT Time Calculation (min) 40 min    Activity Tolerance Patient tolerated treatment well    Behavior During Therapy [facility] for tasks assessed/performed                      History reviewed. No pertinent past medical history. Past Surgical History:  Procedure Laterality Date   KNEE ARTHROSCOPY WITH ANTERIOR CRUCIATE LIGAMENT (ACL) REPAIR Left 09/29/2021   Procedure: LEFT KNEE ANTERIOR CRUCIATE LIGAMENT RECONSTRUCTION, PARTIAL MEDIAL MENISCECTOMY;  Surgeon: Leandrew Koyanagi, MD;  Location: [place];  Service: Orthopedics;  Laterality: Left;   Patient Active Problem List   Diagnosis Date Noted   Acute medial meniscus tear of left knee 08/26/2021   New ACL tear, left, initial encounter 07/13/2021    REFERRING DIAG: L93.570V (ICD-10-CM) - Acute medial meniscus tear of left knee, initial encounter S83.512A (ICD-10-CM) - New ACL tear, left, initial encounter   SURGERY DATE 09/29/21  THERAPY DIAG:  Acute pain of left knee  Stiffness of left knee, not elsewhere classified  Localized edema  Muscle weakness (generalized)  Other abnormalities of gait and mobility  Unsteadiness on feet  Rationale for Evaluation and Treatment Rehabilitation  PERTINENT HISTORY:  none  PRECAUTIONS: Other: ACL protocol  SUBJECTIVE:   knee has been aching more; but otherwise doing okay  PAIN:  NPRS scale: 3-4/10 no pain upon arrival.  Pain location: Lt knee Pain description: throbbing Aggravating factors: bending, increased activity, lateral movement Relieving factors: avoiding provoking positions, medication   OBJECTIVE:    PATIENT SURVEYS:  11/17/21: FOTO 65 10/14/21: FOTO 54 (predicted 77)    EDEMA:  10/14/21:Circumferential: Lt knee joint line: 36 cm; Rt knee joint line: 35 cm   POSTURE: No Significant postural limitations   LOWER EXTREMITY ROM:   Active ROM Right eval Left eval Left 10/22/2021 Left 10/27/21 Lt 11/03/21 Lt 11/08/21 Left 11/15/2021 Left 11/29/21  Knee flexion 147 76 90 in AROM supine heel slide 96 (Supine heel slide) AA: 105 A:101 A: 101 AROM in supine heel slide:  114  A: supine heel slide 114  Knee extension 3 -19 (seated LAQ)   -16 (seated LAQ) -4 (seated LAQ)  -3 (seated LAQ)   (Blank rows = not tested)     LOWER EXTREMITY ROM:   Passive ROM Right eval Left eval Left 10/27/21 Left 11/29/21  Knee flexion   86 104   Knee extension   -2  2 (hyperext)   (Blank rows = not tested)   LOWER EXTREMITY MMT: 11/22/2021:  Lower Extremity Right 11/22/2021 Left 11/22/2021 Left 11/29/21   MMT MMT dynamometry  Hip Flexion     Hip Extension     Hip Abduction  Hip Adduction     Hip Internal rotation     Hip External rotation     Knee Flexion  5/5   Knee Extension  4/5 49.3#   Ankle Dorsiflexion     Ankle Plantarflexion     Ankle Inversion     Ankle Eversion      (Blank rows = not tested)  * pain   10/14/21: Not formally tested due to post op status Thigh Girth (6" superior to patella): Lt 40 cm; Rt 43.5 cm   GAIT: 11/15/2021: independent ambulation within clinic.  Wearing locking brace outside of clinic.   10/14/21:  Distance walked: 100' Level of assistance: Modified independence Comments: antalgic gait with  decreased hip/knee flexion on Lt; amb with unlocked hinged knee brace       TODAY'S TREATMENT: 11/29/21 Therex:  BFR (cuff size 3, LOP 170 mmHg, exercises at 127 mmHg except bike) Bike seat 5, 5 mins  (lvl 4), 30 sec rest, 5 mins (lvl 5) c BFR 127 mmHg - goal of 60 rpm or higher LLE only leg press 75# x 30 reps, 3 x 15 c 30 sec rest breaks between each set    Without BFR Quad sets with strap 10 x 10 sec hold, then added 5# weight for passive extension stretch Heel slides with 10 sec hold and strap x 10 reps Quadruped childs pose x 3 reps with 10 sec hold for knee flexion   11/22/21 Therex:  BFR (cuff size 3, LOP 170 mmHg, exercises at 127 mmHg except bike) Bike seat 5, 5 mins  (lvl 3), 30 sec rest, 5 mins (lvl 4) c BFR 120 mmHg - goal of 60 rpm or higher LLE only leg press 62# x 30 reps, 3 x 15 c 30 sec rest breaks between each set    Without BFR LLE single leg deadlift with 10# KB 2x 10 Lt leg lateral step up/down 4 inch c TKE blue band hold at top on step 2 x 10  Neuro Re-ed [employer] fwd/back light touch control x 30 SLS on foam 30 sec x 4 bilateral  11/17/21 Therex:  BFR (cuff size 3, LOP 170 mmHg, exercises at 127 mmHg) Bike seat 5 x 5 min; L3 LLE only leg press 62# x 30 reps, then 50# 3x10 LLE LAQ 3# 30 reps, 3x15 reps (8# last set)    Without BFR LLE single leg deadlift with 10# KB 2x15 - min cues for technique TRX squats with Rt heel raise to increase Lt weight bearing 2x10 Supine Lt hamstring stretch 3x30 sec Prone Lt quad stretch 3x30 sec   PATIENT EDUCATION:  10/22/2021 Education details: HEP progression Person educated: Patient Education method: Consulting civil engineer, Demonstration, and Handouts Education comprehension: verbalized understanding, returned demonstration, and needs further education     HOME EXERCISE PROGRAM: Access Code: [email or web address] URL: [email or web address] Date: 10/22/2021 Prepared by: Scot Jun  Exercises - Quad Setting  and Stretching  - 8-10 x daily - 7 x weekly - 1 sets - 10 reps - 5 sec hold - Supine Heel Slide with Strap  - 8-10 x daily - 7 x weekly - 1 sets - 10 reps - Small Range Straight Leg Raise  - 2-3 x daily - 7 x weekly - 1-2 sets - 10 reps - Sidelying Hip Abduction  - 2-3 x daily - 7 x weekly - 1-2 sets - 10 reps - Supine Knee Extension Mobilization with Weight  - 3-5 x daily - 7 x weekly - 1  sets - 1 reps - 3-5 min hold - [employer]  - 3-5 x daily - 7 x weekly - 1 sets - 1-2 mins hold - Gastroc Stretch on Wall  - 2-3 x daily - 7 x weekly - 1 sets - 3-5 reps - 15-30 hold   ASSESSMENT:   CLINICAL IMPRESSION: Pt still lacking in ROM both flexion and extension, and encouraged consistent work at home to maximize ROM.  Continue to work on [employer] as well with use of BFR.  Will continue to benefit from PT to maximize function.  OBJECTIVE IMPAIRMENTS decreased activity tolerance, decreased balance, decreased mobility, difficulty walking, decreased ROM, decreased strength, increased edema, increased fascial restrictions, increased muscle spasms, and pain.    ACTIVITY LIMITATIONS carrying, lifting, bending, standing, squatting, sleeping, stairs, transfers, and locomotion level   PARTICIPATION LIMITATIONS: cleaning, laundry, driving, shopping, community activity, and sports   PERSONAL FACTORS Time since onset of injury/illness/exacerbation are also affecting patient's functional outcome.    REHAB POTENTIAL: Good   CLINICAL DECISION MAKING: Stable/uncomplicated   EVALUATION COMPLEXITY: Low       GOALS: Goals reviewed with patient? Yes   SHORT TERM GOALS: Target date: 11/11/2021   Independent with initial HEP Goal status: MET 11/08/21   2.  Improve Lt knee AROM 0-100 for improved function Goal status: Met 11/15/2021   3.  Report pain < 4/10 with ROM activities and ambulation Goal status: MET 11/08/21   LONG TERM GOALS: Target date: 12/09/2021   Independent with final HEP Goal  status: on going - assessed 11/15/2021   2.  FOTO score improved to 77 Goal status: on going - assessed 11/15/2021   3.  Lt knee AROM improved to 0-140 for improved function and mobility Goal status: on going - assessed 11/15/2021   4.  Report pain < 2/10 with activity for improved function Goal status: on going - assessed 11/15/2021   5.  Lt thigh girth within 1cm of Rt thigh for improved LLE strength Goal status: on going - assessed 11/15/2021   6.  Amb without device or deviations for improved mobility Goal status: on going - assessed 11/15/2021       PLAN: PT FREQUENCY: 2x/week   PT DURATION: 8 weeks   PLANNED INTERVENTIONS: Therapeutic exercises, Therapeutic activity, Neuromuscular re-education, Balance training, Gait training, Patient/Family education, Joint mobilization, Stair training, Aquatic Therapy, Dry Needling, Electrical stimulation, Cryotherapy, Moist heat, Taping, Vasopneumatic device, Ultrasound, Manual therapy, and Re-evaluation   PLAN FOR NEXT SESSION:   will be due for recert next week.  Needs ROM work as well, Continued progressive strengthening, early compliant surface balance progression   Laureen Abrahams, PT, DPT 11/29/21 2:14 PM      [facility] [address] [place], [place], [address] Phone: [phone]   Fax:  [phone]

## 2021-12-01 ENCOUNTER — Encounter: Payer: Self-pay | Admitting: Physical Therapy

## 2021-12-06 ENCOUNTER — Encounter: Payer: Self-pay | Admitting: Physical Therapy

## 2021-12-06 ENCOUNTER — Telehealth: Payer: Self-pay | Admitting: Physical Therapy

## 2021-12-06 NOTE — Telephone Encounter (Signed)
Called pt as he missed his PT appt today.  VM not set up so unable to leave a message.  Clarita Crane, PT, DPT 12/06/21 9:04 AM

## 2021-12-08 ENCOUNTER — Ambulatory Visit (INDEPENDENT_AMBULATORY_CARE_PROVIDER_SITE_OTHER): Payer: Self-pay | Admitting: Physical Therapy

## 2021-12-08 ENCOUNTER — Encounter: Payer: Self-pay | Admitting: Physical Therapy

## 2021-12-08 DIAGNOSIS — R2689 Other abnormalities of gait and mobility: Secondary | ICD-10-CM

## 2021-12-08 DIAGNOSIS — M25662 Stiffness of left knee, not elsewhere classified: Secondary | ICD-10-CM

## 2021-12-08 DIAGNOSIS — R2681 Unsteadiness on feet: Secondary | ICD-10-CM

## 2021-12-08 DIAGNOSIS — M6281 Muscle weakness (generalized): Secondary | ICD-10-CM

## 2021-12-08 DIAGNOSIS — R600 Localized edema: Secondary | ICD-10-CM

## 2021-12-08 DIAGNOSIS — M25562 Pain in left knee: Secondary | ICD-10-CM

## 2021-12-08 NOTE — Therapy (Signed)
[facility] [address] [place], [place], [address] Phone: [phone]   Fax:  [phone]  Patient Details  Name: Keith Frank MRN: [id number] Date of Birth: Feb 08, 1991 Referring Provider:  No ref. provider found  Encounter Date: 12/08/2021   OUTPATIENT PHYSICAL THERAPY TREATMENT NOTE   Patient Name: Keith Frank MRN: [id number] DOB:08/24/1990, 31 y.o., male Today's Date: 12/08/2021  PCP: none REFERRING PROVIDER: Dr. Erlinda Hong, MD  END OF SESSION:   PT End of Session - 12/08/21 0923     Visit Number 12    Number of Visits 22    Date for PT Re-Evaluation 01/12/22    Authorization Type CAFA 07/13/21-01/13/22    PT Start Time 0920    PT Stop Time 1000    PT Time Calculation (min) 40 min    Activity Tolerance Patient tolerated treatment well    Behavior During Therapy [facility] for tasks assessed/performed                       History reviewed. No pertinent past medical history. Past Surgical History:  Procedure Laterality Date   KNEE ARTHROSCOPY WITH ANTERIOR CRUCIATE LIGAMENT (ACL) REPAIR Left 09/29/2021   Procedure: LEFT KNEE ANTERIOR CRUCIATE LIGAMENT RECONSTRUCTION, PARTIAL MEDIAL MENISCECTOMY;  Surgeon: Leandrew Koyanagi, MD;  Location: [place];  Service: Orthopedics;  Laterality: Left;   Patient Active Problem List   Diagnosis Date Noted   Acute medial meniscus tear of left knee 08/26/2021   New ACL tear, left, initial encounter 07/13/2021    REFERRING DIAG: Y09.983J (ICD-10-CM) - Acute medial meniscus tear of left knee, initial encounter S83.512A (ICD-10-CM) - New ACL tear, left, initial encounter   SURGERY DATE 09/29/21  THERAPY DIAG:  Acute pain of left knee  Stiffness of left knee, not elsewhere classified  Localized edema  Muscle weakness (generalized)  Other abnormalities of gait and mobility  Unsteadiness on feet  Rationale for Evaluation and Treatment Rehabilitation  PERTINENT HISTORY:  none  PRECAUTIONS: Other: ACL protocol  SUBJECTIVE:   doing well, no complaints  PAIN:  NPRS scale: 0/10 no pain upon arrival.  Pain location: Lt knee Pain description: throbbing Aggravating factors: bending, increased activity, lateral movement Relieving factors: avoiding provoking positions, medication   OBJECTIVE:    PATIENT SURVEYS:  12/08/21: FOTO 69 11/17/21: FOTO 65 10/14/21: FOTO 54 (predicted 77)     EDEMA:  10/14/21:Circumferential: Lt knee joint line: 36 cm; Rt knee joint line: 35 cm   POSTURE: No Significant postural limitations   LOWER EXTREMITY ROM:   Active ROM Right eval Left eval Left 10/22/2021 Left 10/27/21 Lt 11/03/21 Lt 11/08/21 Left 11/15/2021 Left 11/29/21 Left 12/08/21  Knee flexion 147 76 90 in AROM supine heel slide 96 (Supine heel slide) AA: 105 A:101 A: 101 AROM in supine heel slide:  114  A: supine heel slide 114 A: supine heel slide: 121   Knee extension 3 -19 (seated LAQ)   -16 (seated LAQ) -4 (seated LAQ)  -3 (seated LAQ) 0 (seated LAQ)   (Blank rows = not tested)     LOWER EXTREMITY ROM:   Passive ROM Right eval Left eval Left 10/27/21 Left 11/29/21 Left 12/08/21  Knee flexion   86 104  AA: 122  Knee extension   -2  2 (hyperext)    (Blank rows = not tested)   LOWER EXTREMITY MMT: 11/22/2021:  Lower Extremity Right 11/22/2021 Left 11/22/2021 Right 11/29/21 Left 11/29/21   MMT MMT dynamometry dynamometry  Hip Flexion      Hip Extension      Hip Abduction      Hip Adduction      Hip Internal rotation      Hip External rotation      Knee Flexion  5/5    Knee Extension  4/5 97.1# 49.3#   Ankle Dorsiflexion      Ankle Plantarflexion      Ankle Inversion      Ankle Eversion       (Blank rows = not tested)  * pain   10/14/21: Not formally tested due to post op status Thigh Girth (6" superior to patella): Lt 40 cm; Rt 43.5 cm  12/08/21: Thigh Girth (6" superior to patella): Lt 40 cm; Rt 45 cm   FUNCTIONAL TESTS: 12/08/21:  Y  Balance Test: Composite Score:  Rt 116% Lt 105% (Increased difficulty with left anterior noted during testing)    GAIT: 12/08/21: Still with antalgic gait and lacking TKE with Lt heel strike  11/15/2021: independent ambulation within clinic.  Wearing locking brace outside of clinic.   10/14/21:  Distance walked: 100' Level of assistance: Modified independence Comments: antalgic gait with decreased hip/knee flexion on Lt; amb with unlocked hinged knee brace       TODAY'S TREATMENT: 12/08/21 Therex: Bike L10 x 8 min LLE only deadlift 15# KB 3x10 Leg Press LLE only 100# 3x10 Thigh Girth measurements, and ROM as noted above  Physical Performance Test: Y Balance Test - See notes above    11/29/21 Therex:  BFR (cuff size 3, LOP 170 mmHg, exercises at 127 mmHg except bike) Bike seat 5, 5 mins  (lvl 4), 30 sec rest, 5 mins (lvl 5) c BFR 127 mmHg - goal of 60 rpm or higher LLE only leg press 75# x 30 reps, 3 x 15 c 30 sec rest breaks between each set    Without BFR Quad sets with strap 10 x 10 sec hold, then added 5# weight for passive extension stretch Heel slides with 10 sec hold and strap x 10 reps Quadruped childs pose x 3 reps with 10 sec hold for knee flexion   11/22/21 Therex:  BFR (cuff size 3, LOP 170 mmHg, exercises at 127 mmHg except bike) Bike seat 5, 5 mins  (lvl 3), 30 sec rest, 5 mins (lvl 4) c BFR 120 mmHg - goal of 60 rpm or higher LLE only leg press 62# x 30 reps, 3 x 15 c 30 sec rest breaks between each set    Without BFR LLE single leg deadlift with 10# KB 2x 10 Lt leg lateral step up/down 4 inch c TKE blue band hold at top on step 2 x 10  Neuro Re-ed KeySpan fwd/back light touch control x 30 SLS on foam 30 sec x 4 bilateral  PATIENT EDUCATION:  10/22/2021 Education details: HEP progression Person educated: Patient Education method: Consulting civil engineer, Demonstration, and Handouts Education comprehension: verbalized understanding, returned demonstration,  and needs further education     HOME EXERCISE PROGRAM: Access Code: [email or web address] URL: [email or web address] Date: 10/22/2021 Prepared by: Scot Jun  Exercises - Quad Setting and Stretching  - 8-10 x daily - 7 x weekly - 1 sets - 10 reps - 5 sec hold - Supine Heel Slide with Strap  - 8-10 x daily - 7 x weekly - 1 sets - 10 reps - Small Range Straight Leg Raise  - 2-3 x daily - 7 x weekly - 1-2 sets - 10 reps -  Sidelying Hip Abduction  - 2-3 x daily - 7 x weekly - 1-2 sets - 10 reps - Supine Knee Extension Mobilization with Weight  - 3-5 x daily - 7 x weekly - 1 sets - 1 reps - 3-5 min hold - Church Pew  - 3-5 x daily - 7 x weekly - 1 sets - 1-2 mins hold - Gastroc Stretch on Wall  - 2-3 x daily - 7 x weekly - 1 sets - 3-5 reps - 15-30 hold   ASSESSMENT:   CLINICAL IMPRESSION: Progress made towards all LTGs at this time, but he has only met 1 LTG.  Will continue all LTGs and added additional goal for 5 more weeks of PT.  This is when his insurance will run out, so will continue based on insurance and pt's ability to pay.    OBJECTIVE IMPAIRMENTS decreased activity tolerance, decreased balance, decreased mobility, difficulty walking, decreased ROM, decreased strength, increased edema, increased fascial restrictions, increased muscle spasms, and pain.    ACTIVITY LIMITATIONS carrying, lifting, bending, standing, squatting, sleeping, stairs, transfers, and locomotion level   PARTICIPATION LIMITATIONS: cleaning, laundry, driving, shopping, community activity, and sports   PERSONAL FACTORS Time since onset of injury/illness/exacerbation are also affecting patient's functional outcome.    REHAB POTENTIAL: Good   CLINICAL DECISION MAKING: Stable/uncomplicated   EVALUATION COMPLEXITY: Low       GOALS: Goals reviewed with patient? Yes   SHORT TERM GOALS: Target date: 11/11/2021   Independent with initial HEP Goal status: MET 11/08/21   2.  Improve Lt knee AROM 0-100  for improved function Goal status: Met 11/15/2021   3.  Report pain < 4/10 with ROM activities and ambulation Goal status: MET 11/08/21   LONG TERM GOALS: Target date: 01/12/22   Independent with final HEP Goal status: on going - assessed 12/08/21   2.  FOTO score improved to 77 Goal status: on going - assessed 12/08/21   3.  Lt knee AROM improved to 0-140 for improved function and mobility Goal status: on going - assessed 12/08/21   4.  Report pain < 2/10 with activity for improved function Goal status: MET 12/08/21   5.  Lt thigh girth within 1cm of Rt thigh for improved LLE strength Goal status: on going - assessed 12/08/21   6.  Amb without device or deviations for improved mobility Goal status: Partially Met - continue goal 12/08/21  7.  Lt quad strength improved to at least 75# for improved strength  Goal status: New        PLAN: PT FREQUENCY: 2x/week   PT DURATION: 8 weeks   PLANNED INTERVENTIONS: Therapeutic exercises, Therapeutic activity, Neuromuscular re-education, Balance training, Gait training, Patient/Family education, Joint mobilization, Stair training, Aquatic Therapy, Dry Needling, Electrical stimulation, Cryotherapy, Moist heat, Taping, Vasopneumatic device, Ultrasound, Manual therapy, and Re-evaluation   PLAN FOR NEXT SESSION:   continue strengthening, work on maximizing flexion, progressive strengthening, early compliant surface balance progression   Laureen Abrahams, PT, DPT 12/08/21 10:50 AM      [facility] [address] [place], [place], [address] Phone: [phone]   Fax:  [phone]

## 2021-12-09 ENCOUNTER — Ambulatory Visit (INDEPENDENT_AMBULATORY_CARE_PROVIDER_SITE_OTHER): Payer: Self-pay | Admitting: Physician Assistant

## 2021-12-09 DIAGNOSIS — Z9889 Other specified postprocedural states: Secondary | ICD-10-CM

## 2021-12-09 NOTE — Progress Notes (Signed)
   Post-Op Visit Note   Patient: Keith Frank           Date of Birth: [birth year]           MRN: [id number] Visit Date: 12/09/2021 PCP: Patient, No Pcp Per   Assessment & Plan:  Chief Complaint:  Chief Complaint  Patient presents with   Left Knee - Follow-up   Visit Diagnoses:  1. S/P arthroscopy of left knee   2. S/P ACL reconstruction     Plan: Patient is a pleasant 31 year old gentleman who comes in today almost 3 months status post left knee ACL reconstruction with medial meniscectomy 09/29/2021.  He has been doing well.  He has been in physical therapy making good progress with range of motion and strength.  He has been compliant wearing his playmaker brace but notes that this is uncomfortable at times.  Examination left knee reveals no effusion.  Range of motion 0 to 115 degrees.  5 out of 5 strength with resisted straight leg raise.  His graft feels stable.  He is neurovascularly intact distally.  At this point, he will continue with formal physical therapy as well as a home exercise program.  I have discussed with him that I feel comfortable allowing him to discontinue his playmaker brace as long as him and his physical therapist feel comfortable with this.  He will continue to avoid any activities such as running jumping cutting, etc.  He will follow-up with [place] in 3 months time for recheck.  Call with concerns or questions in the meantime.  Follow-Up Instructions: Return if symptoms worsen or fail to improve.   Orders:  No orders of the defined types were placed in this encounter.  No orders of the defined types were placed in this encounter.   Imaging: No new imaging  PMFS History: Patient Active Problem List   Diagnosis Date Noted   Acute medial meniscus tear of left knee 08/26/2021   New ACL tear, left, initial encounter 07/13/2021   No past medical history on file.  No family history on file.  Past Surgical History:  Procedure Laterality Date   KNEE  ARTHROSCOPY WITH ANTERIOR CRUCIATE LIGAMENT (ACL) REPAIR Left 09/29/2021   Procedure: LEFT KNEE ANTERIOR CRUCIATE LIGAMENT RECONSTRUCTION, PARTIAL MEDIAL MENISCECTOMY;  Surgeon: Tarry Kos, MD;  Location: [facility];  Service: Orthopedics;  Laterality: Left;   Social History   Occupational History   Not on file  Tobacco Use   Smoking status: Every Day   Smokeless tobacco: Never  Vaping Use   Vaping Use: Never used  Substance and Sexual Activity   Alcohol use: Yes    Comment: occ   Drug use: No   Sexual activity: Not on file

## 2021-12-14 ENCOUNTER — Encounter: Payer: Self-pay | Admitting: Rehabilitative and Restorative Service Providers"

## 2021-12-16 ENCOUNTER — Encounter: Payer: Self-pay | Admitting: Physical Therapy

## 2021-12-20 ENCOUNTER — Ambulatory Visit (INDEPENDENT_AMBULATORY_CARE_PROVIDER_SITE_OTHER): Payer: Self-pay | Admitting: Physical Therapy

## 2021-12-20 ENCOUNTER — Encounter: Payer: Self-pay | Admitting: Physical Therapy

## 2021-12-20 DIAGNOSIS — M25662 Stiffness of left knee, not elsewhere classified: Secondary | ICD-10-CM

## 2021-12-20 DIAGNOSIS — R2681 Unsteadiness on feet: Secondary | ICD-10-CM

## 2021-12-20 DIAGNOSIS — R600 Localized edema: Secondary | ICD-10-CM

## 2021-12-20 DIAGNOSIS — R2689 Other abnormalities of gait and mobility: Secondary | ICD-10-CM

## 2021-12-20 DIAGNOSIS — M6281 Muscle weakness (generalized): Secondary | ICD-10-CM

## 2021-12-20 DIAGNOSIS — M25562 Pain in left knee: Secondary | ICD-10-CM

## 2021-12-20 NOTE — Therapy (Addendum)
[facility] [address] [place], [place], [address] Phone: [phone]   Fax:  [phone]  Patient Details  Name: Keith Frank MRN: [id number] Date of Birth: 01-Jun-1990 Referring Provider:  No ref. provider found  Encounter Date: 12/20/2021   OUTPATIENT PHYSICAL THERAPY TREATMENT NOTE DISCHARGE SUMMARY   Patient Name: Keith Frank MRN: [id number] DOB:[birth year], 31 y.o., male Today's Date: 12/20/2021  PCP: none REFERRING PROVIDER: Dr. Erlinda Hong, MD  END OF SESSION:   PT End of Session - 12/20/21 1018     Visit Number 13    Number of Visits 22    Date for PT Re-Evaluation 01/12/22    Authorization Type CAFA 07/13/21-01/13/22    PT Start Time 1014    PT Stop Time 1052    PT Time Calculation (min) 38 min    Activity Tolerance Patient tolerated treatment well    Behavior During Therapy [facility] for tasks assessed/performed                        History reviewed. No pertinent past medical history. Past Surgical History:  Procedure Laterality Date   KNEE ARTHROSCOPY WITH ANTERIOR CRUCIATE LIGAMENT (ACL) REPAIR Left 09/29/2021   Procedure: LEFT KNEE ANTERIOR CRUCIATE LIGAMENT RECONSTRUCTION, PARTIAL MEDIAL MENISCECTOMY;  Surgeon: Leandrew Koyanagi, MD;  Location: [place];  Service: Orthopedics;  Laterality: Left;   Patient Active Problem List   Diagnosis Date Noted   Acute medial meniscus tear of left knee 08/26/2021   New ACL tear, left, initial encounter 07/13/2021    REFERRING DIAG: H63.149F (ICD-10-CM) - Acute medial meniscus tear of left knee, initial encounter S83.512A (ICD-10-CM) - New ACL tear, left, initial encounter   SURGERY DATE 09/29/21  THERAPY DIAG:  Acute pain of left knee  Stiffness of left knee, not elsewhere classified  Localized edema  Muscle weakness (generalized)  Other abnormalities of gait and mobility  Unsteadiness on feet  Rationale for Evaluation and Treatment  Rehabilitation  PERTINENT HISTORY: none  PRECAUTIONS: Other: ACL protocol  SUBJECTIVE:   knee is doing well  PAIN:  NPRS scale: 0/10 no pain upon arrival.  Pain location: Lt knee Pain description: throbbing Aggravating factors: bending, increased activity, lateral movement Relieving factors: avoiding provoking positions, medication   OBJECTIVE:    PATIENT SURVEYS:  12/08/21: FOTO 69 11/17/21: FOTO 65 10/14/21: FOTO 54 (predicted 77)     EDEMA:  10/14/21:Circumferential: Lt knee joint line: 36 cm; Rt knee joint line: 35 cm   POSTURE: No Significant postural limitations   LOWER EXTREMITY ROM:   Active ROM Right eval Left eval Left 10/22/2021 Left 10/27/21 Lt 11/03/21 Lt 11/08/21 Left 11/15/2021 Left 11/29/21 Left 12/08/21 Left 12/20/21  Knee flexion 147 76 90 in AROM supine heel slide 96 (Supine heel slide) AA: 105 A:101 A: 101 AROM in supine heel slide:  114  A: supine heel slide 114 A: supine heel slide: 121  A: supine heel slide: 120  Knee extension 3 -19 (seated LAQ)   -16 (seated LAQ) -4 (seated LAQ)  -3 (seated LAQ) 0 (seated LAQ)    (Blank rows = not tested)     LOWER EXTREMITY ROM:   Passive ROM Right eval Left eval Left 10/27/21 Left 11/29/21 Left 12/08/21  Knee flexion   86 104  AA: 122  Knee extension   -2  2 (hyperext)    (Blank rows = not tested)   LOWER EXTREMITY MMT: 11/22/2021:  Lower Extremity Right 11/22/2021 Left  11/22/2021 Right 11/29/21 Left 11/29/21   MMT MMT dynamometry dynamometry  Hip Flexion      Hip Extension      Hip Abduction      Hip Adduction      Hip Internal rotation      Hip External rotation      Knee Flexion  5/5    Knee Extension  4/5 97.1# 49.3#   Ankle Dorsiflexion      Ankle Plantarflexion      Ankle Inversion      Ankle Eversion       (Blank rows = not tested)  * pain   10/14/21: Not formally tested due to post op status Thigh Girth (6" superior to patella): Lt 40 cm; Rt 43.5 cm  12/08/21: Thigh Girth (6"  superior to patella): Lt 40 cm; Rt 45 cm   FUNCTIONAL TESTS: 12/08/21:  Y Balance Test: Composite Score:  Rt 116% Lt 105% (Increased difficulty with left anterior noted during testing)    GAIT: 12/08/21: Still with antalgic gait and lacking TKE with Lt heel strike  11/15/2021: independent ambulation within clinic.  Wearing locking brace outside of clinic.   10/14/21:  Distance walked: 100' Level of assistance: Modified independence Comments: antalgic gait with decreased hip/knee flexion on Lt; amb with unlocked hinged knee brace       TODAY'S TREATMENT: 12/20/21 Therex: Bike L7-10 x 8 min Leg Press LLE only 100# 3x10 Lt knee extension machine to ~10 deg 5#; 3x10 LLE single leg dead lift 20# KB 3x10 Squats on ramp 10# 3x10 LLE calf raise on slant board 3x10; 2 sec hold  Long sitting AA heel slides x 10 reps on Lt Childs pose 5x10-15 sec holds for knee flexion    12/08/21 Therex: Bike L10 x 8 min LLE only deadlift 15# KB 3x10 Leg Press LLE only 100# 3x10 Thigh Girth measurements, and ROM as noted above  Physical Performance Test: Y Balance Test - See notes above    11/29/21 Therex:  BFR (cuff size 3, LOP 170 mmHg, exercises at 127 mmHg except bike) Bike seat 5, 5 mins  (lvl 4), 30 sec rest, 5 mins (lvl 5) c BFR 127 mmHg - goal of 60 rpm or higher LLE only leg press 75# x 30 reps, 3 x 15 c 30 sec rest breaks between each set    Without BFR Quad sets with strap 10 x 10 sec hold, then added 5# weight for passive extension stretch Heel slides with 10 sec hold and strap x 10 reps Quadruped childs pose x 3 reps with 10 sec hold for knee flexion   11/22/21 Therex:  BFR (cuff size 3, LOP 170 mmHg, exercises at 127 mmHg except bike) Bike seat 5, 5 mins  (lvl 3), 30 sec rest, 5 mins (lvl 4) c BFR 120 mmHg - goal of 60 rpm or higher LLE only leg press 62# x 30 reps, 3 x 15 c 30 sec rest breaks between each set    Without BFR LLE single leg deadlift with 10# KB 2x 10 Lt  leg lateral step up/down 4 inch c TKE blue band hold at top on step 2 x 10  Neuro Re-ed KeySpan fwd/back light touch control x 30 SLS on foam 30 sec x 4 bilateral  PATIENT EDUCATION:  10/22/2021 Education details: HEP progression Person educated: Patient Education method: Consulting civil engineer, Demonstration, and Handouts Education comprehension: verbalized understanding, returned demonstration, and needs further education     HOME EXERCISE PROGRAM:  Access Code: [email or web address] URL: [email or web address] Date: 10/22/2021 Prepared by: Scot Jun  Exercises - Quad Setting and Stretching  - 8-10 x daily - 7 x weekly - 1 sets - 10 reps - 5 sec hold - Supine Heel Slide with Strap  - 8-10 x daily - 7 x weekly - 1 sets - 10 reps - Small Range Straight Leg Raise  - 2-3 x daily - 7 x weekly - 1-2 sets - 10 reps - Sidelying Hip Abduction  - 2-3 x daily - 7 x weekly - 1-2 sets - 10 reps - Supine Knee Extension Mobilization with Weight  - 3-5 x daily - 7 x weekly - 1 sets - 1 reps - 3-5 min hold - [place] Pew  - 3-5 x daily - 7 x weekly - 1 sets - 1-2 mins hold - Gastroc Stretch on Wall  - 2-3 x daily - 7 x weekly - 1 sets - 3-5 reps - 15-30 hold   ASSESSMENT:   CLINICAL IMPRESSION: Pt missed a couple weeks of PT, but no loss in ROM noted but still lacking in flexion.  Continue to work on progressing LLE strength.  Will continue to benefit from PT to maximize function.  OBJECTIVE IMPAIRMENTS decreased activity tolerance, decreased balance, decreased mobility, difficulty walking, decreased ROM, decreased strength, increased edema, increased fascial restrictions, increased muscle spasms, and pain.    ACTIVITY LIMITATIONS carrying, lifting, bending, standing, squatting, sleeping, stairs, transfers, and locomotion level   PARTICIPATION LIMITATIONS: cleaning, laundry, driving, shopping, community activity, and sports   PERSONAL FACTORS Time since onset of injury/illness/exacerbation are  also affecting patient's functional outcome.    REHAB POTENTIAL: Good   CLINICAL DECISION MAKING: Stable/uncomplicated   EVALUATION COMPLEXITY: Low       GOALS: Goals reviewed with patient? Yes   SHORT TERM GOALS: Target date: 11/11/2021   Independent with initial HEP Goal status: MET 11/08/21   2.  Improve Lt knee AROM 0-100 for improved function Goal status: Met 11/15/2021   3.  Report pain < 4/10 with ROM activities and ambulation Goal status: MET 11/08/21   LONG TERM GOALS: Target date: 01/12/22   Independent with final HEP Goal status: on going - assessed 12/08/21   2.  FOTO score improved to 77 Goal status: on going - assessed 12/08/21   3.  Lt knee AROM improved to 0-140 for improved function and mobility Goal status: on going - assessed 12/08/21   4.  Report pain < 2/10 with activity for improved function Goal status: MET 12/08/21   5.  Lt thigh girth within 1cm of Rt thigh for improved LLE strength Goal status: on going - assessed 12/08/21   6.  Amb without device or deviations for improved mobility Goal status: Partially Met - continue goal 12/08/21  7.  Lt quad strength improved to at least 75# for improved strength  Goal status: New        PLAN: PT FREQUENCY: 2x/week   PT DURATION: 8 weeks   PLANNED INTERVENTIONS: Therapeutic exercises, Therapeutic activity, Neuromuscular re-education, Balance training, Gait training, Patient/Family education, Joint mobilization, Stair training, Aquatic Therapy, Dry Needling, Electrical stimulation, Cryotherapy, Moist heat, Taping, Vasopneumatic device, Ultrasound, Manual therapy, and Re-evaluation   PLAN FOR NEXT SESSION:   continue strengthening, work on maximizing flexion, progressive strengthening, early compliant surface balance progression (may need to adjust schedule based on work)   Laureen Abrahams, PT, DPT 12/20/21 11:21 AM      [facility] [address]  [place], [place],  [address] Phone: [phone]   Fax:  [phone]    PHYSICAL THERAPY DISCHARGE SUMMARY  Visits from Start of Care: 13  Current functional level related to goals / functional outcomes: See above   Remaining deficits: See above   Education / Equipment: HEP   Patient agrees to discharge. Patient goals were partially met. Patient is being discharged due to not returning since the last visit.  Laureen Abrahams, PT, DPT 03/01/22 12:32 PM  [facility] [address] [place], [place], [address] Phone: [phone]   Fax:  [phone]

## 2021-12-22 ENCOUNTER — Encounter: Payer: Self-pay | Admitting: Physical Therapy

## 2021-12-28 ENCOUNTER — Encounter: Payer: Self-pay | Admitting: Physical Therapy

## 2021-12-28 ENCOUNTER — Telehealth: Payer: Self-pay | Admitting: Physical Therapy

## 2021-12-28 NOTE — Telephone Encounter (Signed)
Attempted to call pt as he did not show for his PT appt, VM not set up and unable to leave a message.

## 2021-12-30 ENCOUNTER — Encounter: Payer: Self-pay | Admitting: Physical Therapy

## 2023-02-25 IMAGING — MR MR KNEE*L* W/O CM
4 of 7 series · 22 of 40 positions shown · non-contrast
Comparison: None.

CLINICAL DATA: Left knee pain.  Snowboarding accident 1 year ago.

EXAM:
MRI OF THE LEFT KNEE WITHOUT CONTRAST
TECHNIQUE: Multiplanar, multisequence MR imaging of the knee was performed. No
intravenous contrast was administered.

[Series 3: T2 fat-sat · axial · 4.0mm · 0.50mm/px · z∈[-15,+100]mm · 6 of 24 slices shown]
[im 1/24]
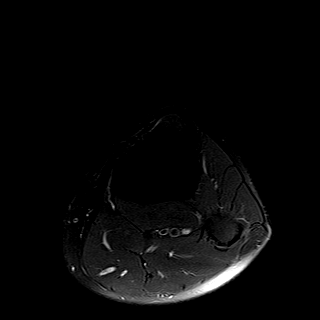
[im 5/24]
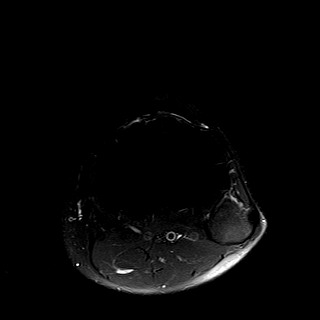
[im 10/24]
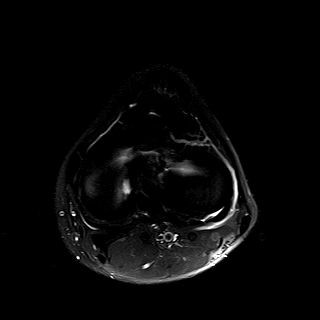
[im 14/24]
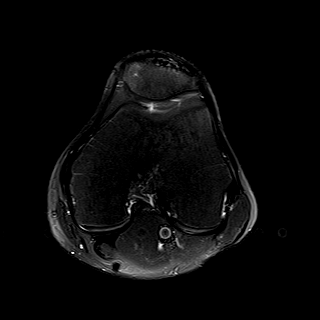
[im 19/24]
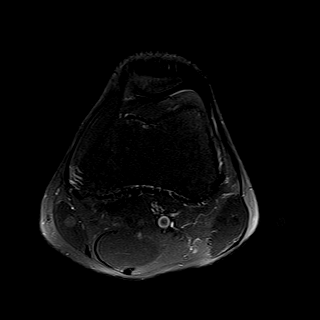
[im 24/24]
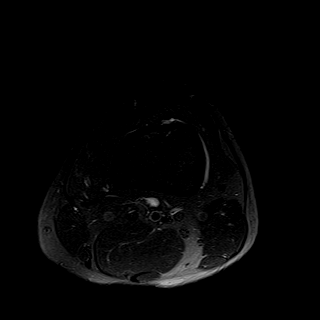

[Series 7: PD fat-sat · sagittal · 3.0mm · 0.29mm/px · 6 of 28 slices shown (1 of 3)]
[im 1/28]
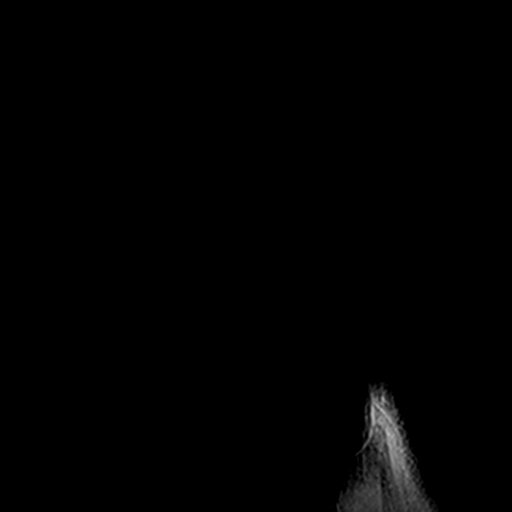
[im 6/28]
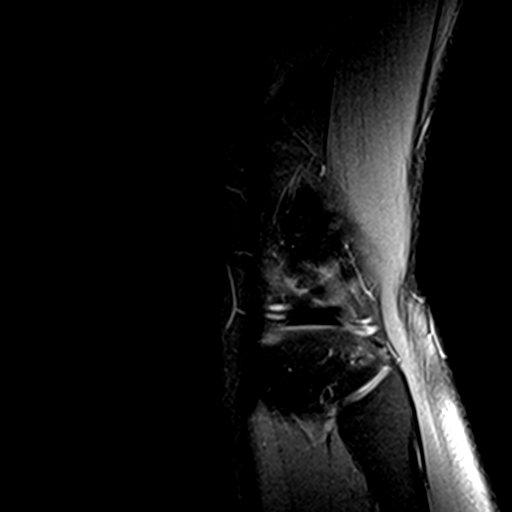
[im 11/28]
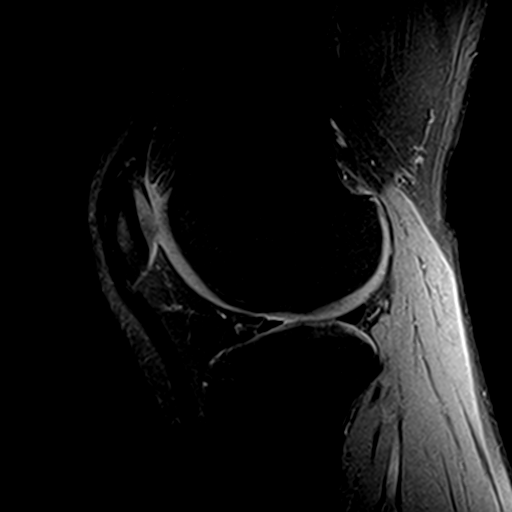
[im 17/28]
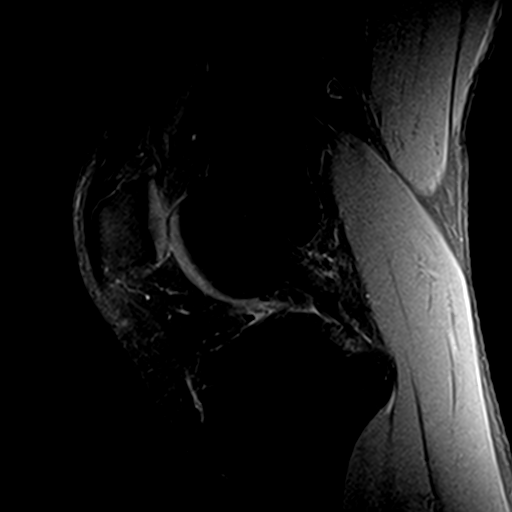
[im 22/28]
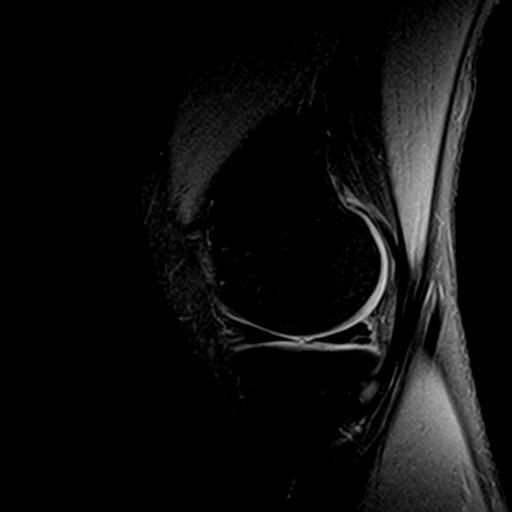
[im 28/28]
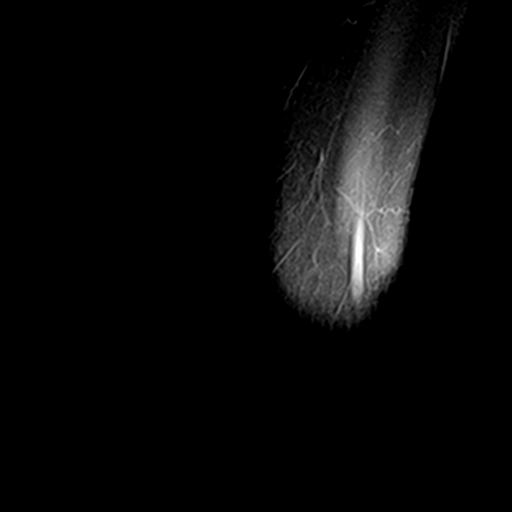

[Series 8: PD fat-sat · coronal · 3.0mm · 0.29mm/px · 7 of 30 slices shown (2 of 3)]
[im 1/30]
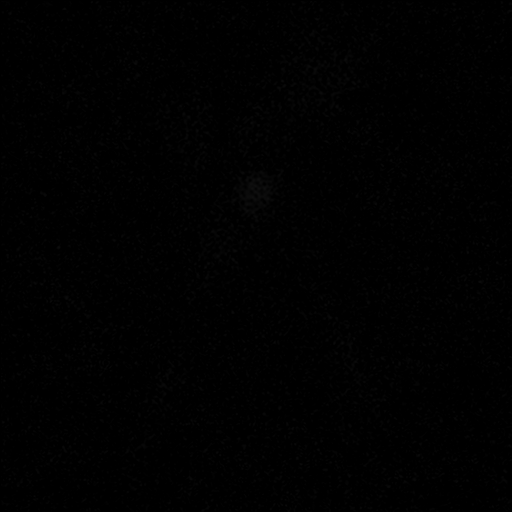
[im 5/30]
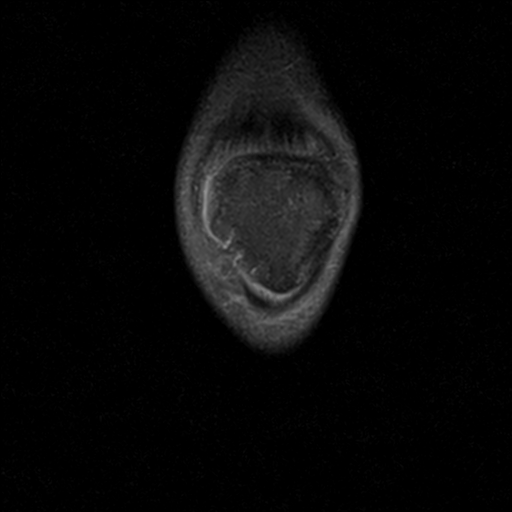
[im 10/30]
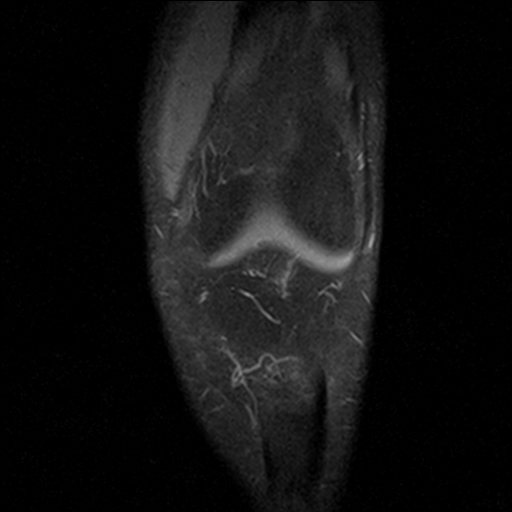
[im 15/30]
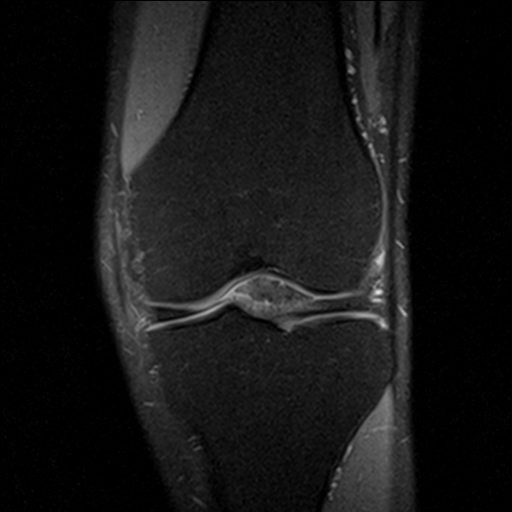
[im 20/30]
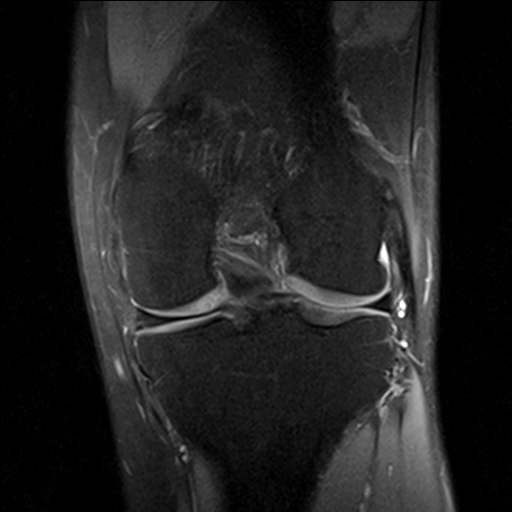
[im 25/30]
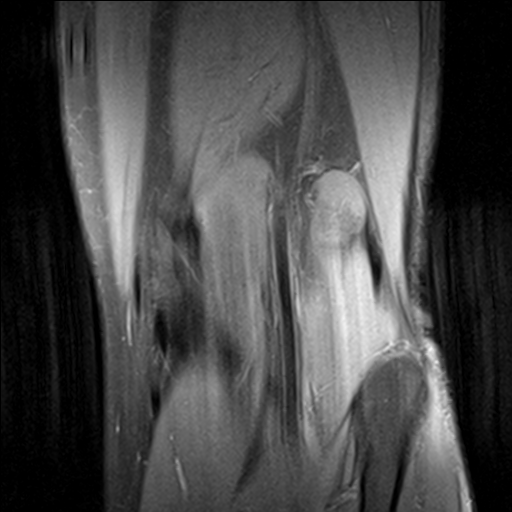
[im 30/30]
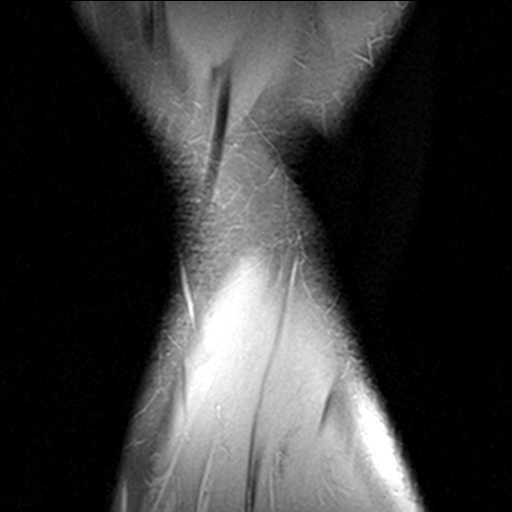

[Series 9: PD fat-sat · oblique · 2.3mm · 0.29mm/px · 3 of 11 slices shown (3 of 3)]
[im 1/11]
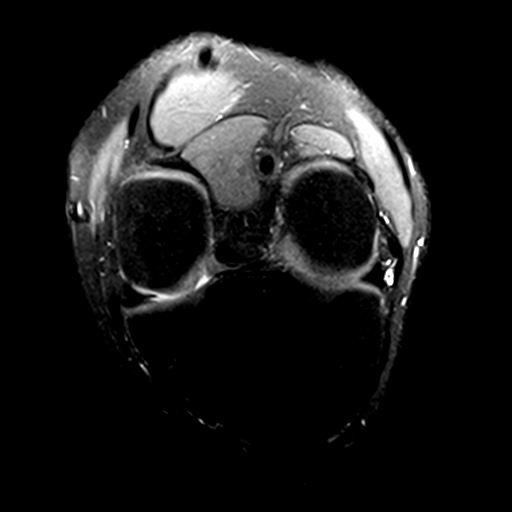
[im 6/11]
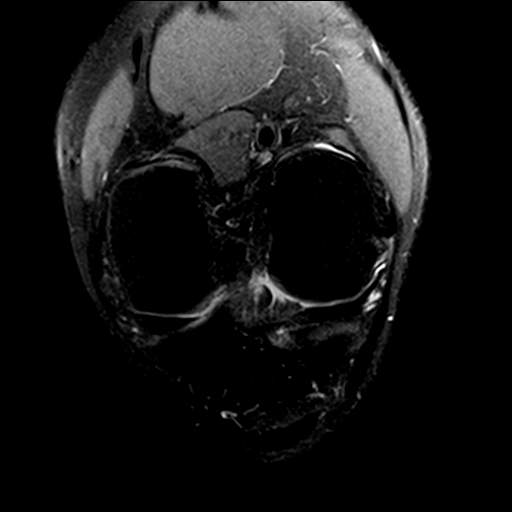
[im 11/11]
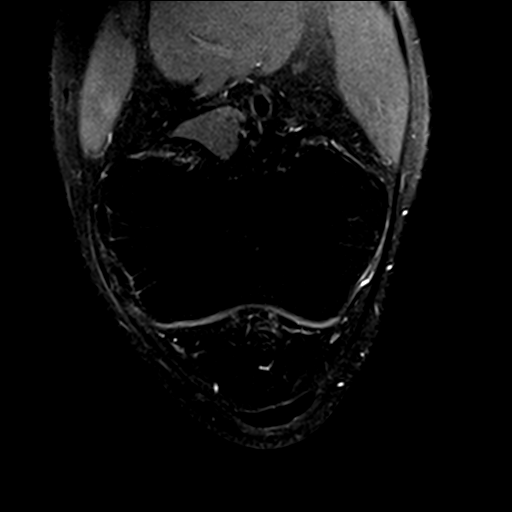

[22 of 40 positions shown; findings below may reference images not displayed]

FINDINGS: MENISCI

Medial: Vertical linear signal abnormality at the meniscocapsular
junction of the posterior horn of the medial meniscus consistent
with prior meniscocapsular injury without acute separation. Small
undersurface tear of the posterior horn-body junction of the medial
meniscus along the posterior periphery.

Lateral: Intact.

LIGAMENTS

Cruciates: Partial attenuation of the distal ACL consistent with a
chronic partial-thickness tear with scarring. Intact PCL.

Collaterals: Prior MCL injury with chronic thickening. No acute tear
of the medial collateral ligament. Lateral collateral ligament
complex is intact.

CARTILAGE

Patellofemoral:  No chondral defect.

Medial: Partial-thickness cartilage loss of the medial femorotibial
compartment.

Lateral:  No chondral defect.

JOINT: No joint effusion. Normal Quirijn Amazigh. No plical
thickening.

POPLITEAL FOSSA: Popliteus tendon is intact. No Baker's cyst.

EXTENSOR MECHANISM: Intact quadriceps tendon. Intact patellar
tendon. Intact lateral patellar retinaculum. Intact medial patellar
retinaculum. Intact MPFL.

BONES: No aggressive osseous lesion. No fracture or dislocation.

Other: No fluid collection or hematoma. Muscles are normal.
IMPRESSION: 1. Vertical linear signal abnormality at the meniscocapsular
junction of the posterior horn of the medial meniscus consistent
with prior meniscocapsular injury without acute separation. Small
undersurface tear of the posterior horn-body junction of the medial
meniscus along the posterior periphery.
2. Partial-thickness cartilage loss of the medial femorotibial
compartment.
3. Prior MCL injury with chronic thickening.  No acute MCL tear.
4. Partial attenuation of the distal ACL consistent with a chronic
partial-thickness tear with scarring.
